# Patient Record
Sex: Male | Born: 1955 | Race: Black or African American | Hispanic: No | Smoking: Never smoker
Health system: Southern US, Community
[De-identification: ages and names within clinical notes are randomized; demographics above are authoritative.]

## PROBLEM LIST (undated history)

## (undated) DIAGNOSIS — I499 Cardiac arrhythmia, unspecified: Secondary | ICD-10-CM

## (undated) DIAGNOSIS — I10 Essential (primary) hypertension: Secondary | ICD-10-CM

## (undated) HISTORY — PX: JOINT REPLACEMENT: SHX530

---

## 2007-02-20 ENCOUNTER — Ambulatory Visit: Payer: Self-pay | Admitting: Cardiology

## 2016-05-21 ENCOUNTER — Emergency Department (HOSPITAL_COMMUNITY): Payer: Medicare Other

## 2016-05-21 ENCOUNTER — Encounter (HOSPITAL_COMMUNITY): Payer: Self-pay | Admitting: Emergency Medicine

## 2016-05-21 ENCOUNTER — Emergency Department (HOSPITAL_COMMUNITY)
Admission: EM | Admit: 2016-05-21 | Discharge: 2016-05-21 | Disposition: A | Discharge status: Home / Self Care | Payer: Medicare Other | Attending: Emergency Medicine | Admitting: Emergency Medicine

## 2016-05-21 DIAGNOSIS — E876 Hypokalemia: Secondary | ICD-10-CM | POA: Insufficient documentation

## 2016-05-21 DIAGNOSIS — R9431 Abnormal electrocardiogram [ECG] [EKG]: Secondary | ICD-10-CM

## 2016-05-21 DIAGNOSIS — Z7982 Long term (current) use of aspirin: Secondary | ICD-10-CM | POA: Insufficient documentation

## 2016-05-21 DIAGNOSIS — R531 Weakness: Secondary | ICD-10-CM | POA: Diagnosis present

## 2016-05-21 DIAGNOSIS — E871 Hypo-osmolality and hyponatremia: Secondary | ICD-10-CM | POA: Diagnosis not present

## 2016-05-21 DIAGNOSIS — F17200 Nicotine dependence, unspecified, uncomplicated: Secondary | ICD-10-CM | POA: Diagnosis not present

## 2016-05-21 DIAGNOSIS — Z79899 Other long term (current) drug therapy: Secondary | ICD-10-CM | POA: Insufficient documentation

## 2016-05-21 DIAGNOSIS — I10 Essential (primary) hypertension: Secondary | ICD-10-CM | POA: Diagnosis not present

## 2016-05-21 HISTORY — DX: Essential (primary) hypertension: I10

## 2016-05-21 LAB — CBC WITH DIFFERENTIAL/PLATELET
BASOS ABS: 0 10*3/uL (ref 0.0–0.1)
BASOS PCT: 0 %
Eosinophils Absolute: 0.1 10*3/uL (ref 0.0–0.7)
Eosinophils Relative: 1 %
HEMATOCRIT: 37.8 % — AB (ref 39.0–52.0)
Hemoglobin: 13.3 g/dL (ref 13.0–17.0)
Lymphocytes Relative: 18 %
Lymphs Abs: 0.9 10*3/uL (ref 0.7–4.0)
MCH: 31.7 pg (ref 26.0–34.0)
MCHC: 35.2 g/dL (ref 30.0–36.0)
MCV: 90 fL (ref 78.0–100.0)
MONO ABS: 0.5 10*3/uL (ref 0.1–1.0)
Monocytes Relative: 9 %
NEUTROS ABS: 3.7 10*3/uL (ref 1.7–7.7)
NEUTROS PCT: 72 %
PLATELETS: 239 10*3/uL (ref 150–400)
RBC: 4.2 MIL/uL — ABNORMAL LOW (ref 4.22–5.81)
RDW: 12.8 % (ref 11.5–15.5)
WBC: 5.2 10*3/uL (ref 4.0–10.5)

## 2016-05-21 LAB — I-STAT TROPONIN, ED: TROPONIN I, POC: 0.04 ng/mL (ref 0.00–0.08)

## 2016-05-21 LAB — BASIC METABOLIC PANEL
ANION GAP: 10 (ref 5–15)
BUN: 21 mg/dL — ABNORMAL HIGH (ref 6–20)
CALCIUM: 8.5 mg/dL — AB (ref 8.9–10.3)
CO2: 27 mmol/L (ref 22–32)
Chloride: 92 mmol/L — ABNORMAL LOW (ref 101–111)
Creatinine, Ser: 1.33 mg/dL — ABNORMAL HIGH (ref 0.61–1.24)
GFR, EST NON AFRICAN AMERICAN: 56 mL/min — AB (ref >60)
Glucose, Bld: 111 mg/dL — ABNORMAL HIGH (ref 65–99)
Potassium: 2.6 mmol/L — CL (ref 3.5–5.1)
Sodium: 129 mmol/L — ABNORMAL LOW (ref 135–145)

## 2016-05-21 MED ORDER — VITAMIN B-1 100 MG PO TABS
100.0000 mg | ORAL_TABLET | Freq: Once | ORAL | Status: AC
  Administered 2016-05-21: 100 mg via ORAL
  Filled 2016-05-21: qty 1

## 2016-05-21 MED ORDER — FOLIC ACID 1 MG PO TABS
1.0000 mg | ORAL_TABLET | Freq: Once | ORAL | Status: AC
  Administered 2016-05-21: 1 mg via ORAL
  Filled 2016-05-21: qty 1

## 2016-05-21 MED ORDER — VITAMIN B-1 100 MG PO TABS: 100.0000 mg | ORAL_TABLET | Freq: Every day | ORAL | 0 refills | Status: AC

## 2016-05-21 MED ORDER — FOLIC ACID 1 MG PO TABS: 1.0000 mg | ORAL_TABLET | Freq: Every day | ORAL | 0 refills | Status: AC

## 2016-05-21 MED ORDER — SODIUM CHLORIDE 0.9 % IV BOLUS (SEPSIS): 1000.0000 mL | Freq: Once | INTRAVENOUS | Status: DC

## 2016-05-21 MED ORDER — POTASSIUM CHLORIDE CRYS ER 20 MEQ PO TBCR
40.0000 meq | EXTENDED_RELEASE_TABLET | Freq: Once | ORAL | Status: AC
  Administered 2016-05-21: 40 meq via ORAL
  Filled 2016-05-21: qty 2

## 2016-05-21 MED ORDER — POTASSIUM CHLORIDE CRYS ER 20 MEQ PO TBCR: 40.0000 meq | EXTENDED_RELEASE_TABLET | Freq: Every day | ORAL | 0 refills | Status: DC

## 2016-05-21 NOTE — ED Notes (Signed)
Pt iv infiltrated from EMS. Pt refused another stick. edp aware. Nad. Pt drank 3 cups of water

## 2016-05-21 NOTE — ED Notes (Signed)
EDP in with pt. Per dr zammit and dr Jacqulyn BathLong, ekg does not show STEMI

## 2016-05-21 NOTE — ED Notes (Signed)
Pt states just got real hot and sweaty, denied any weakness or dizziness. Denies any sx's now. Denied any cp/tightness or pressure. Pt nondiaphoretic upon arrival

## 2016-05-21 NOTE — ED Provider Notes (Signed)
Emergency Department Provider Note   I have reviewed the triage vital signs and the nursing notes.   HISTORY  Chief Complaint Weakness   HPI Luis Nunez is a 61 y.o. male with PMH of HTN and polysubstance abuse since the emergency pertinent for evaluation of sudden onset generalized weakness and flushing/heat wave sensation. Patient has been in his normal state of health over the past several months. He's been compliant with his medications. Today he was at home when he suddenly felt warm all over with some lightheadedness. No associated chest pain, palpitations, dyspnea. Patient was not using any drugs or drinking alcohol this time. He does have a history of cocaine abuse, last used yesterday, and EtOH abuse. No change in his medications. No radiation of symptoms. No full syncope. He is currently feeling back to his normal self.  Past Medical History:  Diagnosis Date  . Hypertension     There are no active problems to display for this patient.   History reviewed. No pertinent surgical history.  Current Outpatient Rx  . Order #: [id number] Class: Historical Med  . Order #: [id number] Class: Historical Med  . Order #: [id number] Class: Historical Med  . Order #: [id number] Class: Historical Med  . Order #: [id number] Class: Print  . Order #: [id number] Class: Print  . Order #: [id number] Class: Print    Allergies Penicillins  History reviewed. No pertinent family history.  Social History Social History  Substance Use Topics  . Smoking status: Never Smoker  . Smokeless tobacco: Current User  . Alcohol use Yes     Comment: beer almost daily    Review of Systems  Constitutional: No fever/chills. Positive lightheadedness. Positive sudden warm sensation.  Eyes: No visual changes. ENT: No sore throat. Cardiovascular: Denies chest pain. Respiratory: Denies shortness of breath. Gastrointestinal: No abdominal pain.  No nausea, no vomiting.  No diarrhea.  No  constipation. Genitourinary: Negative for dysuria. Musculoskeletal: Negative for back pain. Skin: Negative for rash. Neurological: Negative for headaches, focal weakness or numbness.  10-point ROS otherwise negative.  ____________________________________________   PHYSICAL EXAM:  VITAL SIGNS: ED Triage Vitals  Enc Vitals Group     BP 05/21/16 1525 (!) 177/106     Pulse Rate 05/21/16 1525 88     Resp 05/21/16 1525 18     Temp 05/21/16 1525 97.9 F (36.6 C)     Temp Source 05/21/16 1525 Oral     SpO2 05/21/16 1525 99 %     Pain Score 05/21/16 1516 0   Constitutional: Alert and oriented. Well appearing and in no acute distress. Eyes: Conjunctivae are normal. PERRL. Head: Atraumatic. Nose: No congestion/rhinnorhea. Mouth/Throat: Mucous membranes are moist. Neck: No stridor. Cardiovascular: Normal rate, regular rhythm. Good peripheral circulation. Grossly normal heart sounds.   Respiratory: Normal respiratory effort.  No retractions. Lungs CTAB. Gastrointestinal: Soft and nontender. No distention.  Musculoskeletal: No lower extremity tenderness nor edema. No gross deformities of extremities. Neurologic:  Normal speech and language. No gross focal neurologic deficits are appreciated.  Skin:  Skin is warm, dry and intact. No rash noted. Psychiatric: Mood and affect are normal. Speech and behavior are normal. ____________________________________________   LABS (all labs ordered are listed, but only abnormal results are displayed)  Labs Reviewed  CBC WITH DIFFERENTIAL/PLATELET - Abnormal; Notable for the following:       Result Value   RBC 4.20 (*)    HCT 37.8 (*)    All other components within normal limits  BASIC METABOLIC PANEL - Abnormal; Notable  for the following:    Sodium 129 (*)    Potassium 2.6 (*)    Chloride 92 (*)    Glucose, Bld 111 (*)    BUN 21 (*)    Creatinine, Ser 1.33 (*)    Calcium 8.5 (*)    GFR calc non Af Amer 56 (*)    All other components  within normal limits  I-STAT TROPOININ, ED   ____________________________________________  EKG   EKG Interpretation  Date/Time:  Monday May 21 2016 15:25:15 EDT Ventricular Rate:  85 PR Interval:    QRS Duration: 160 QT Interval:  461 QTC Calculation: 549 R Axis:   -35 Text Interpretation:  Sinus rhythm Right bundle branch block LVH by voltage Lateral infarct, acute Prolonged QT interval No STEMI.  Confirmed by Vi Biddinger MD, Talor Cheema [phone]([id number]) on 05/21/2016 3:45:17 PM       ____________________________________________  RADIOLOGY  Dg Chest Portable 1 View  Result Date: 05/21/2016 CLINICAL DATA:  Near syncopal episode. Weakness and dizziness today. EXAM: PORTABLE CHEST 1 VIEW COMPARISON:  Radiographs 01/21/2011. FINDINGS: 1534 hours. Lordotic positioning. The heart size and mediastinal contours are normal. The lungs are clear. There is no pleural effusion or pneumothorax. No acute osseous findings are identified. Telemetry leads overlie the chest. IMPRESSION: Stable chest.  No active cardiopulmonary process. Electronically Signed   By: Carey BullocksWilliam  Veazey M.D.   On: 05/21/2016 15:51    ____________________________________________   PROCEDURES  Procedure(s) performed:   Procedures  None ____________________________________________   INITIAL IMPRESSION / ASSESSMENT AND PLAN / ED COURSE  Pertinent labs & imaging results that were available during my care of the patient were reviewed by me and considered in my medical decision making (see chart for details).  Patient presents to the emergency department for evaluation of generalized weakness and near syncope event. EKG shows right bundle with prolonged QT interval. No old tracing for comparison. Patient is a polysubstance abuser. No cocaine today but did drink 1 beer this AM.   04:35 PM Updated the patient regarding his lab results. He has hypokalemia, hyponatremia, and prolonged QT interval on EKG. With his near syncope discussed  that I was concerned regarding his electrolyte levels. He is not describing any generalized weakness or fatigue. I suspect this lab abnormalities are driven by his alcohol and other substance abuse. He endorses a poor diet. I advised that he be admitted to the hospital for repletion of his potassium and repeat EKG but he refused. I discussed that failure to treat this appropriately could lead to fatal arrhythmia and disability/death. He states that he can follow up with his PMD this week for repeat labs. Will give IV fluids here along with potassium supplementation. Also giving Thiamine and Folate.   04:50 PM Patient's IV from EMS infiltrated and patient is refusing additional IV. Will d/c IVF order. Will discharge with oral supplementation. Discussed strict return precautions and PCP follow up in the coming days as above.   EKG, labs, nursing notes, and imaging reviewed.  ____________________________________________  FINAL CLINICAL IMPRESSION(S) / ED DIAGNOSES  Final diagnoses:  Generalized weakness  Hypokalemia  Hyponatremia  Prolonged Q-T interval on ECG     MEDICATIONS GIVEN DURING THIS VISIT:  Medications  sodium chloride 0.9 % bolus 1,000 mL (not administered)  potassium chloride SA (K-DUR,KLOR-CON) CR tablet 40 mEq (not administered)  thiamine (VITAMIN B-1) tablet 100 mg (not administered)  folic acid (FOLVITE) tablet 1 mg (not administered)     NEW OUTPATIENT MEDICATIONS STARTED DURING THIS VISIT:  New Prescriptions   FOLIC ACID (FOLVITE) 1 MG TABLET    Take 1 tablet (1 mg total) by mouth daily.   POTASSIUM CHLORIDE SA (K-DUR,KLOR-CON) 20 MEQ TABLET    Take 2 tablets (40 mEq total) by mouth daily.   THIAMINE (VITAMIN B-1) 100 MG TABLET    Take 1 tablet (100 mg total) by mouth daily.      Note:  This document was prepared using Dragon voice recognition software and may include unintentional dictation errors.  Alona Bene, MD Emergency Medicine   Maia Plan,  MD 05/21/16 [phone]

## 2016-05-21 NOTE — ED Triage Notes (Signed)
Pt with wife and became weak and dizzy. cbg 140. Near syncope. Pt arrived a/o. No gen weakness noted. Pt had to have bm as soon as he got to ED. Pt stable on feet. A/o

## 2016-05-21 NOTE — Discharge Instructions (Signed)
You were seen today with sudden weakness and almost passing out. We found that your potassium was low and you EKG was abnormal. We are prescribing potassium supplements to take for the next several days along with multivitamins. You need to call your PCP tomorrow and schedule an appointment in the next 2-4 days for repeat blood work and repeat EKG.   Return to the ED with worsening weakness, numbness, fainting, chest pain, or palpitations.

## 2016-05-21 NOTE — ED Notes (Addendum)
In to dc pt and pt not in room.did not received dc papers.  Tried both number and family number with no answer or opportunity to leave message. Received number from his family that is admitted. Tried [phone] [phone] and left message.

## 2017-02-02 DIAGNOSIS — I499 Cardiac arrhythmia, unspecified: Secondary | ICD-10-CM

## 2017-02-02 HISTORY — DX: Cardiac arrhythmia, unspecified: I49.9

## 2017-02-02 NOTE — H&P (Signed)
TOTAL HIP ADMISSION H&P  Patient is admitted for left total hip arthroplasty, anterior approach.  Subjective:  Chief Complaint:   Left hip primary OA / pain  HPI: Luis Nunez, 61 y.o. male, has a history of pain and functional disability in the left hip(s) due to arthritis and patient has failed non-surgical conservative treatments for greater than 12 weeks to include NSAID's and/or analgesics and activity modification.  Onset of symptoms was gradual starting 14-15 years ago with gradually worsening course since that time.The patient noted no past surgery on the left hip(s).  Patient currently rates pain in the left hip at 3 out of 10 with activity. Patient has night pain, worsening of pain with activity and weight bearing, trendelenberg gait, pain that interfers with activities of daily living and pain with passive range of motion. Patient has evidence of periarticular osteophytes and joint space narrowing by imaging studies. This condition presents safety issues increasing the risk of falls.  There is no current active infection.  Risks, benefits and expectations were discussed with the patient.  Risks including but not limited to the risk of anesthesia, blood clots, nerve damage, blood vessel damage, failure of the prosthesis, infection and up to and including death.  Patient understand the risks, benefits and expectations and wishes to proceed with surgery.   PCP: Louie Bostonapper, David B., MD  D/C Plans:       Home  Post-op Meds:       No Rx given  Tranexamic Acid:      To be given - IV   Decadron:      Is to be given  FYI:      ASA  Norco  DME:   Rx given for - RW   PT:   No PT    Past Medical History:  Diagnosis Date  . Hypertension     No past surgical history on file.  No current facility-administered medications for this encounter.    Current Outpatient Medications  Medication Sig Dispense Refill Last Dose  . acetaminophen (TYLENOL) 650 MG CR tablet Take 1,300 mg by mouth  every 8 (eight) hours as needed for pain.   05/21/2016 at 0630  . aspirin EC 81 MG tablet Take 81 mg by mouth daily.   05/21/2016 at 0630  . Aspirin-Caffeine (BC FAST PAIN RELIEF ARTHRITIS) 1000-65 MG PACK Take 2 packets by mouth 2 (two) times daily as needed (for pain).   05/21/2016 at 0630  . lisinopril-hydrochlorothiazide (PRINZIDE,ZESTORETIC) 20-12.5 MG tablet Take 1 tablet by mouth daily.   05/21/2016 at Unknown time  . potassium chloride SA (K-DUR,KLOR-CON) 20 MEQ tablet Take 2 tablets (40 mEq total) by mouth daily. 10 tablet 0    Allergies  Allergen Reactions  . Penicillins Rash and Other (See Comments)    Has patient had a PCN reaction causing immediate rash, facial/tongue/throat swelling, SOB or lightheadedness with hypotension: No Has patient had a PCN reaction causing severe rash involving mucus membranes or skin necrosis: No Has patient had a PCN reaction that required hospitalization No Has patient had a PCN reaction occurring within the last 10 years: No If all of the above answers are "NO", then may proceed with Cephalosporin use.    Social History   Tobacco Use  . Smoking status: Never Smoker  . Smokeless tobacco: Current User  Substance Use Topics  . Alcohol use: Yes    Comment: beer almost daily       Review of Systems  Constitutional: Negative.  HENT: Negative.   Eyes: Negative.   Respiratory: Negative.   Cardiovascular: Negative.   Gastrointestinal: Negative.   Genitourinary: Negative.   Musculoskeletal: Positive for joint pain.  Skin: Negative.   Neurological: Negative.   Endo/Heme/Allergies: Negative.   Psychiatric/Behavioral: Negative.     Objective:  Physical Exam  Constitutional: He is oriented to person, place, and time. He appears well-developed.  HENT:  Head: Normocephalic.  Eyes: Pupils are equal, round, and reactive to light.  Neck: Neck supple. No JVD present. No tracheal deviation present. No thyromegaly present.  Cardiovascular: Normal  rate, regular rhythm and intact distal pulses.  Respiratory: Effort normal and breath sounds normal. No respiratory distress. He has no wheezes.  GI: Soft. There is no tenderness. There is no guarding.  Musculoskeletal:       Left hip: He exhibits decreased range of motion, decreased strength, tenderness and bony tenderness. He exhibits no swelling, no deformity and no laceration.  Lymphadenopathy:    He has no cervical adenopathy.  Neurological: He is alert and oriented to person, place, and time.  Skin: Skin is warm and dry.  Psychiatric: He has a normal mood and affect.      Imaging Review Plain radiographs demonstrate severe degenerative joint disease of the left hip(s). The bone quality appears to be good for age and reported activity level.  Assessment/Plan:  End stage arthritis, left hip(s)  The patient history, physical examination, clinical judgement of the provider and imaging studies are consistent with end stage degenerative joint disease of the left hip(s) and total hip arthroplasty is deemed medically necessary. The treatment options including medical management, injection therapy, arthroscopy and arthroplasty were discussed at length. The risks and benefits of total hip arthroplasty were presented and reviewed. The risks due to aseptic loosening, infection, stiffness, dislocation/subluxation,  thromboembolic complications and other imponderables were discussed.  The patient acknowledged the explanation, agreed to proceed with the plan and consent was signed. Patient is being admitted for inpatient treatment for surgery, pain control, PT, OT, prophylactic antibiotics, VTE prophylaxis, progressive ambulation and ADL's and discharge planning.The patient is planning to be discharged home.     Anastasio AuerbachMatthew S. Isaiah Torok   PA-C  02/02/2017, 1:30 PM

## 2017-02-04 NOTE — Progress Notes (Signed)
05-21-16 (Epic) CXR  05-14-16 (Epic) EKG

## 2017-02-04 NOTE — Patient Instructions (Signed)
Luis Nunez  02/04/2017   Your procedure is scheduled on: 02-12-17   [facility]  Entrance Report to Admitting at 9:00 AM   Call this number if you have problems the morning of surgery  [phone]   Remember: ONLY 1 PERSON MAY GO WITH YOU TO SHORT STAY TO GET  READY MORNING OF YOUR SURGERY.  Do not eat food or drink liquids :After Midnight.     Take these medicines the morning of surgery with A SIP OF WATER: None                                 You may not have any metal on your body including hair pins and              piercings  Do not wear jewelry, make-up, lotions, powders or perfumes, deodorant             Do not wear nail polish.  Do not shave  48 hours prior to surgery.              Men may shave face and neck.   Do not bring valuables to the hospital. [facility] IS NOT             RESPONSIBLE   FOR VALUABLES.  Contacts, dentures or bridgework may not be worn into surgery.  Leave suitcase in the car. After surgery it may be brought to your room.                Please read over the following fact sheets you were given: _____________________________________________________________________             [facility] Before surgery, you can play an important role.  Because skin is not sterile, your skin needs to be as free of germs as possible.  You can reduce the number of germs on your skin by washing with CHG (chlorahexidine gluconate) soap before surgery.  CHG is an antiseptic cleaner which kills germs and bonds with the skin to continue killing germs even after washing. Please DO NOT use if you have an allergy to CHG or antibacterial soaps.  If your skin becomes reddened/irritated stop using the CHG and inform your nurse when you arrive at Short Stay. Do not shave (including legs and underarms) for at least 48 hours prior to the first CHG shower.  You may shave your face/neck. Please follow these  instructions carefully:  1.  Shower with CHG Soap the night before surgery and the  morning of Surgery.  2.  If you choose to wash your hair, wash your hair first as usual with your  normal  shampoo.  3.  After you shampoo, rinse your hair and body thoroughly to remove the  shampoo.                           4.  Use CHG as you would any other liquid soap.  You can apply chg directly  to the skin and wash                       Gently with a scrungie or clean washcloth.  5.  Apply the CHG Soap to your body ONLY FROM THE NECK  DOWN.   Do not use on face/ open                           Wound or open sores. Avoid contact with eyes, ears mouth and genitals (private parts).                       Wash face,  Genitals (private parts) with your normal soap.             6.  Wash thoroughly, paying special attention to the area where your surgery  will be performed.  7.  Thoroughly rinse your body with warm water from the neck down.  8.  DO NOT shower/wash with your normal soap after using and rinsing off  the CHG Soap.                9.  Pat yourself dry with a clean towel.            10.  Wear clean pajamas.            11.  Place clean sheets on your bed the night of your first shower and do not  sleep with pets. Day of Surgery : Do not apply any lotions/deodorants the morning of surgery.  Please wear clean clothes to the hospital/surgery center.  FAILURE TO FOLLOW THESE INSTRUCTIONS MAY RESULT IN THE CANCELLATION OF YOUR SURGERY PATIENT SIGNATURE_________________________________  NURSE SIGNATURE__________________________________  ________________________________________________________________________   Adam Phenix  An incentive spirometer is a tool that can help keep your lungs clear and active. This tool measures how well you are filling your lungs with each breath. Taking long deep breaths may help reverse or decrease the chance of developing breathing (pulmonary) problems (especially  infection) following:  A long period of time when you are unable to move or be active. BEFORE THE PROCEDURE   If the spirometer includes an indicator to show your best effort, your nurse or respiratory therapist will set it to a desired goal.  If possible, sit up straight or lean slightly forward. Try not to slouch.  Hold the incentive spirometer in an upright position. INSTRUCTIONS FOR USE  1. Sit on the edge of your bed if possible, or sit up as far as you can in bed or on a chair. 2. Hold the incentive spirometer in an upright position. 3. Breathe out normally. 4. Place the mouthpiece in your mouth and seal your lips tightly around it. 5. Breathe in slowly and as deeply as possible, raising the piston or the ball toward the top of the column. 6. Hold your breath for 3-5 seconds or for as long as possible. Allow the piston or ball to fall to the bottom of the column. 7. Remove the mouthpiece from your mouth and breathe out normally. 8. Rest for a few seconds and repeat Steps 1 through 7 at least 10 times every 1-2 hours when you are awake. Take your time and take a few normal breaths between deep breaths. 9. The spirometer may include an indicator to show your best effort. Use the indicator as a goal to work toward during each repetition. 10. After each set of 10 deep breaths, practice coughing to be sure your lungs are clear. If you have an incision (the cut made at the time of surgery), support your incision when coughing by placing a pillow or rolled up towels firmly against it. Once you are able to  get out of bed, walk around indoors and cough well. You may stop using the incentive spirometer when instructed by your caregiver.  RISKS AND COMPLICATIONS  Take your time so you do not get dizzy or light-headed.  If you are in pain, you may need to take or ask for pain medication before doing incentive spirometry. It is harder to take a deep breath if you are having pain. AFTER  USE  Rest and breathe slowly and easily.  It can be helpful to keep track of a log of your progress. Your caregiver can provide you with a simple table to help with this. If you are using the spirometer at home, follow these instructions: [place] IF:   You are having difficultly using the spirometer.  You have trouble using the spirometer as often as instructed.  Your pain medication is not giving enough relief while using the spirometer.  You develop fever of 100.5 F (38.1 C) or higher. SEEK IMMEDIATE MEDICAL CARE IF:   You cough up bloody sputum that had not been present before.  You develop fever of 102 F (38.9 C) or greater.  You develop worsening pain at or near the incision site. MAKE SURE YOU:   Understand these instructions.  Will watch your condition.  Will get help right away if you are not doing well or get worse. Document Released: 07/02/2006 Document Revised: 05/14/2011 Document Reviewed: 09/02/2006 Exit[employer] Patient Information 2014 ExitCare, [place].   ________________________________________________________________________  WHAT IS A BLOOD TRANSFUSION? Blood Transfusion Information  A transfusion is the replacement of blood or some of its parts. Blood is made up of multiple cells which provide different functions.  Red blood cells carry oxygen and are used for blood loss replacement.  White blood cells fight against infection.  Platelets control bleeding.  Plasma helps clot blood.  Other blood products are available for specialized needs, such as hemophilia or other clotting disorders. BEFORE THE TRANSFUSION  Who gives blood for transfusions?   Healthy volunteers who are fully evaluated to make sure their blood is safe. This is blood bank blood. Transfusion therapy is the safest it has ever been in the practice of medicine. Before blood is taken from a donor, a complete history is taken to make sure that person has no history of diseases  nor engages in risky social behavior (examples are intravenous drug use or sexual activity with multiple partners). The donor's travel history is screened to minimize risk of transmitting infections, such as malaria. The donated blood is tested for signs of infectious diseases, such as HIV and hepatitis. The blood is then tested to be sure it is compatible with you in order to minimize the chance of a transfusion reaction. If you or a relative donates blood, this is often done in anticipation of surgery and is not appropriate for emergency situations. It takes many days to process the donated blood. RISKS AND COMPLICATIONS Although transfusion therapy is very safe and saves many lives, the main dangers of transfusion include:   Getting an infectious disease.  Developing a transfusion reaction. This is an allergic reaction to something in the blood you were given. Every precaution is taken to prevent this. The decision to have a blood transfusion has been considered carefully by your caregiver before blood is given. Blood is not given unless the benefits outweigh the risks. AFTER THE TRANSFUSION  Right after receiving a blood transfusion, you will usually feel much better and more energetic. This is especially true if  your red blood cells have gotten low (anemic). The transfusion raises the level of the red blood cells which carry oxygen, and this usually causes an energy increase.  The nurse administering the transfusion will monitor you carefully for complications. HOME CARE INSTRUCTIONS  No special instructions are needed after a transfusion. You may find your energy is better. Speak with your caregiver about any limitations on activity for underlying diseases you may have. SEEK MEDICAL CARE IF:   Your condition is not improving after your transfusion.  You develop redness or irritation at the intravenous (IV) site. SEEK IMMEDIATE MEDICAL CARE IF:  Any of the following symptoms occur over the  next 12 hours:  Shaking chills.  You have a temperature by mouth above 102 F (38.9 C), not controlled by medicine.  Chest, back, or muscle pain.  People around you feel you are not acting correctly or are confused.  Shortness of breath or difficulty breathing.  Dizziness and fainting.  You get a rash or develop hives.  You have a decrease in urine output.  Your urine turns a dark color or changes to pink, red, or brown. Any of the following symptoms occur over the next 10 days:  You have a temperature by mouth above 102 F (38.9 C), not controlled by medicine.  Shortness of breath.  Weakness after normal activity.  The white part of the eye turns yellow (jaundice).  You have a decrease in the amount of urine or are urinating less often.  Your urine turns a dark color or changes to pink, red, or brown. Document Released: 02/17/2000 Document Revised: 05/14/2011 Document Reviewed: 10/06/2007 [facility] Patient Information 2014 [place], [place].  _______________________________________________________________________

## 2017-02-07 ENCOUNTER — Other Ambulatory Visit: Payer: Self-pay

## 2017-02-07 ENCOUNTER — Encounter (HOSPITAL_COMMUNITY)
Admission: RE | Admit: 2017-02-07 | Discharge: 2017-02-07 | Disposition: A | Discharge status: Home / Self Care | Payer: Medicare Other | Source: Ambulatory Visit | Attending: Orthopedic Surgery | Admitting: Orthopedic Surgery

## 2017-02-07 ENCOUNTER — Encounter (HOSPITAL_COMMUNITY): Payer: Self-pay

## 2017-02-07 DIAGNOSIS — M1612 Unilateral primary osteoarthritis, left hip: Secondary | ICD-10-CM | POA: Diagnosis not present

## 2017-02-07 DIAGNOSIS — Z0183 Encounter for blood typing: Secondary | ICD-10-CM | POA: Insufficient documentation

## 2017-02-07 DIAGNOSIS — Z01812 Encounter for preprocedural laboratory examination: Secondary | ICD-10-CM | POA: Insufficient documentation

## 2017-02-07 LAB — BASIC METABOLIC PANEL
Anion gap: 6 (ref 5–15)
BUN: 22 mg/dL — AB (ref 6–20)
CHLORIDE: 104 mmol/L (ref 101–111)
CO2: 27 mmol/L (ref 22–32)
CREATININE: 1.25 mg/dL — AB (ref 0.61–1.24)
Calcium: 8.7 mg/dL — ABNORMAL LOW (ref 8.9–10.3)
GFR calc Af Amer: >60 mL/min (ref >60)
GFR calc non Af Amer: >60 mL/min (ref >60)
Glucose, Bld: 95 mg/dL (ref 65–99)
Potassium: 4 mmol/L (ref 3.5–5.1)
Sodium: 137 mmol/L (ref 135–145)

## 2017-02-07 LAB — TYPE AND SCREEN
ABO/RH(D): B POS
ANTIBODY SCREEN: NEGATIVE

## 2017-02-07 LAB — ABO/RH: ABO/RH(D): B POS

## 2017-02-07 LAB — SURGICAL PCR SCREEN
MRSA, PCR: NEGATIVE
Staphylococcus aureus: NEGATIVE

## 2017-02-12 ENCOUNTER — Encounter (HOSPITAL_COMMUNITY): Payer: Self-pay | Admitting: Anesthesiology

## 2017-02-12 ENCOUNTER — Encounter (HOSPITAL_COMMUNITY): Admission: RE | Payer: Self-pay | Source: Ambulatory Visit

## 2017-02-12 ENCOUNTER — Inpatient Hospital Stay (HOSPITAL_COMMUNITY): Admission: RE | Admit: 2017-02-12 | Payer: Medicare Other | Source: Ambulatory Visit | Admitting: Orthopedic Surgery

## 2017-02-12 SURGERY — ARTHROPLASTY, HIP, TOTAL, ANTERIOR APPROACH
Anesthesia: Spinal | Site: Hip | Laterality: Left

## 2017-02-14 ENCOUNTER — Other Ambulatory Visit: Payer: Self-pay

## 2017-02-14 ENCOUNTER — Telehealth: Payer: Self-pay | Admitting: Cardiovascular Disease

## 2017-02-14 ENCOUNTER — Ambulatory Visit (INDEPENDENT_AMBULATORY_CARE_PROVIDER_SITE_OTHER): Payer: Medicare Other | Admitting: Cardiovascular Disease

## 2017-02-14 ENCOUNTER — Encounter: Payer: Self-pay | Admitting: Cardiovascular Disease

## 2017-02-14 VITALS — BP 172/106 | HR 87 | Ht 70.0 in | Wt 213.0 lb

## 2017-02-14 DIAGNOSIS — I4892 Unspecified atrial flutter: Secondary | ICD-10-CM | POA: Diagnosis not present

## 2017-02-14 DIAGNOSIS — Z7189 Other specified counseling: Secondary | ICD-10-CM

## 2017-02-14 DIAGNOSIS — I10 Essential (primary) hypertension: Secondary | ICD-10-CM | POA: Diagnosis not present

## 2017-02-14 DIAGNOSIS — Z9289 Personal history of other medical treatment: Secondary | ICD-10-CM | POA: Diagnosis not present

## 2017-02-14 MED ORDER — APIXABAN 5 MG PO TABS: 5.0000 mg | ORAL_TABLET | Freq: Two times a day (BID) | ORAL | 0 refills | Status: DC

## 2017-02-14 MED ORDER — APIXABAN 5 MG PO TABS: 5.0000 mg | ORAL_TABLET | Freq: Two times a day (BID) | ORAL | 6 refills | Status: DC

## 2017-02-14 NOTE — Progress Notes (Signed)
CARDIOLOGY CONSULT NOTE  Patient ID: Haroun Cotham MRN: [id number] DOB/AGE: [birth year] 61 y.o.  Admit date: (Not on file) Primary Physician: Louie Boston., MD Referring Physician: Dr. Margo Common  Reason for Consultation: Atrial flutter  HPI: Agostino Gorin is a 61 y.o. male who is being seen today for the evaluation of atrial flutter at the request of Louie Boston., MD.   Past medical history includes hypertension.  Personally reviewed all relevant documentation from his PCP.  He was hospitalized at [facility] earlier this month for rapid atrial flutter.  Cardiac enzymes were checked and found to be negative.  He was placed on intravenous diltiazem infusion.  Chest x-ray demonstrated cardiomegaly.  He was started on metoprolol and warfarin.  He apparently was due to have hip replacement and was sent by his PCP to the emergency room to obtain an ECG.  Relevant labs: Hemoglobin 13.4, white blood cell 7.1, platelets 289, sodium 140, potassium 3.8, BUN 20, creatinine 1.17.  Unfortunately, no ECGs are available to me at this time.  I will ask my nurse to request them.  He tells me he was diagnosed with hypertension at the age of 67 when he entered the [employer].  He underwent a physical at the age of 71 and was asked to see a cardiologist because he was told he may have had a heart attack.  It sounds like he underwent coronary angiography and was told it was normal.  He denies chest pain, palpitations, shortness of breath, dizziness, and leg swelling.  He was completely unaware that he was in an abnormal heart rhythm while hospitalized recently.  He did not take his medications today.  He has been on lisinopril and hydrochlorothiazide for some time and was recently started on metoprolol.  ECG performed today which I personally interpreted demonstrated atrial flutter with an underlying right bundle branch block and somewhat variable conduction, heart rate 72 bpm.   Allergies    Allergen Reactions  . Penicillins Rash and Other (See Comments)    Has patient had a PCN reaction causing immediate rash, facial/tongue/throat swelling, SOB or lightheadedness with hypotension: No Has patient had a PCN reaction causing severe rash involving mucus membranes or skin necrosis: No Has patient had a PCN reaction that required hospitalization No Has patient had a PCN reaction occurring within the last 10 years: No If all of the above answers are "NO", then may proceed with Cephalosporin use.    Current Outpatient Medications  Medication Sig Dispense Refill  . acetaminophen (TYLENOL) 650 MG CR tablet Take 1,300 mg by mouth every 8 (eight) hours as needed for pain.    Marland Kitchen aspirin EC 81 MG tablet Take 81 mg by mouth daily.    . Aspirin-Caffeine (BC FAST PAIN RELIEF ARTHRITIS) 1000-65 MG PACK Take 2 packets by mouth 2 (two) times daily as needed (for pain).    Marland Kitchen lisinopril-hydrochlorothiazide (PRINZIDE,ZESTORETIC) 20-12.5 MG tablet Take 2 tablets by mouth daily.     . metoprolol tartrate (LOPRESSOR) 25 MG tablet Take 25 mg by mouth 2 (two) times daily.    Marland Kitchen warfarin (COUMADIN) 5 MG tablet Take 5 mg by mouth daily.     No current facility-administered medications for this visit.     Past Medical History:  Diagnosis Date  . Hypertension     History reviewed. No pertinent surgical history.  Social History   Socioeconomic History  . Marital status: Divorced    Spouse name: Not on file  .  Number of children: Not on file  . Years of education: Not on file  . Highest education level: Not on file  Social Needs  . Financial resource strain: Not on file  . Food insecurity - worry: Not on file  . Food insecurity - inability: Not on file  . Transportation needs - medical: Not on file  . Transportation needs - non-medical: Not on file  Occupational History  . Not on file  Tobacco Use  . Smoking status: Never Smoker  . Smokeless tobacco: Current User  Substance and Sexual  Activity  . Alcohol use: Yes    Comment: beer almost daily  . Drug use: Yes    Types: Cocaine    Comment: last used cocaine yesterday.   Marland Kitchen. Sexual activity: No  Other Topics Concern  . Not on file  Social History Narrative  . Not on file     No family history of premature CAD in 1st degree relatives.  Current Meds  Medication Sig  . acetaminophen (TYLENOL) 650 MG CR tablet Take 1,300 mg by mouth every 8 (eight) hours as needed for pain.  Marland Kitchen. aspirin EC 81 MG tablet Take 81 mg by mouth daily.  . Aspirin-Caffeine (BC FAST PAIN RELIEF ARTHRITIS) 1000-65 MG PACK Take 2 packets by mouth 2 (two) times daily as needed (for pain).  Marland Kitchen. lisinopril-hydrochlorothiazide (PRINZIDE,ZESTORETIC) 20-12.5 MG tablet Take 2 tablets by mouth daily.   . metoprolol tartrate (LOPRESSOR) 25 MG tablet Take 25 mg by mouth 2 (two) times daily.  Marland Kitchen. warfarin (COUMADIN) 5 MG tablet Take 5 mg by mouth daily.      Review of systems complete and found to be negative unless listed above in HPI    Physical exam Blood pressure (!) 172/106, pulse 87, height 5\' 10"  (1.778 m), weight 213 lb (96.6 kg), SpO2 97 %. General: NAD Neck: No JVD, no thyromegaly or thyroid nodule.  Lungs: Clear to auscultation bilaterally with normal respiratory effort. CV: Nondisplaced PMI. Regular rate and irregular rhythm, normal S1/S2, no S3, no murmur.  No peripheral edema.  No carotid bruit.    Abdomen: Soft, nontender, no distention.  Skin: Intact without lesions or rashes.  Neurologic: Alert and oriented x 3.  Psych: Normal affect. Extremities: No clubbing or cyanosis.  HEENT: Normal.   ECG: Most recent ECG reviewed.   Labs: Lab Results  Component Value Date/Time   K 4.0 02/07/2017 10:00 AM   BUN 22 (H) 02/07/2017 10:00 AM   CREATININE 1.25 (H) 02/07/2017 10:00 AM   HGB 13.3 05/21/2016 03:33 PM     Lipids: No results found for: LDLCALC, LDLDIRECT, CHOL, TRIG, HDL      ASSESSMENT AND PLAN:  1.  Atrial flutter: He is  entirely asymptomatic. CHADSVASC score is 1, thus anticoagulation is not definitively indicated as thromboembolic risk is lower.  However, I will elect to anticoagulate but I will discontinue warfarin and start apixaban 5 mg twice daily.  I will obtain an echocardiogram to evaluate cardiac structure and function.  I spoke to him about potential ablation with referral to an electrophysiologist.  Heart rate is currently controlled without AV nodal blocking agents as he has not taken his metoprolol today.  However, given his hypertension and likely propensity for tachycardia, I think it is reasonable to continue metoprolol. I will make a referral to electrophysiology for ablation consideration.  2.  Chronic hypertension: Blood pressure is elevated but he has not taken his medications today and he has recently started metoprolol  in addition to his lisinopril and hydrochlorothiazide.  I will monitor this to see if further antihypertensive titration is indicated.  I will obtain an echocardiogram to evaluate cardiac structure and function.     Disposition: Follow up in 2 months  Signed: Prentice DockerSuresh Sashay Felling, M.D., F.A.C.C.  02/14/2017, 8:57 AM

## 2017-02-14 NOTE — Addendum Note (Signed)
Addended by: Lesle ChrisHILL, ANGELA G on: 02/14/2017 10:35 AM   Modules accepted: Orders

## 2017-02-14 NOTE — Telephone Encounter (Signed)
Pre-cert Verification for the following procedure   °Echo scheduled for 02-20-17  °

## 2017-02-14 NOTE — Patient Instructions (Addendum)
Medication Instructions:   Stop Warfarin  Stop Aspirin   Begin Eliquis 5mg  twice a day.   Continue all other medications.    Labwork: none  Testing/Procedures:  Your physician has requested that you have an echocardiogram. Echocardiography is a painless test that uses sound waves to create images of your heart. It provides your doctor with information about the size and shape of your heart and how well your heart's chambers and valves are working. This procedure takes approximately one hour. There are no restrictions for this procedure.  Office will contact with results via phone or letter.    Follow-Up: 2 months   Any Other Special Instructions Will Be Listed Below (If Applicable).  You have been referred to:  Dr. Loman BrooklynWill Camnitz (EP)   Establish with anticoagulation nurse Vashti Hey(Lisa Reid, RN) - 1 month.  If you need a refill on your cardiac medications before your next appointment, please call your pharmacy.

## 2017-02-20 ENCOUNTER — Other Ambulatory Visit: Payer: Self-pay

## 2017-02-20 ENCOUNTER — Ambulatory Visit (INDEPENDENT_AMBULATORY_CARE_PROVIDER_SITE_OTHER): Payer: Medicare Other

## 2017-02-20 ENCOUNTER — Telehealth: Payer: Self-pay

## 2017-02-20 DIAGNOSIS — I4892 Unspecified atrial flutter: Secondary | ICD-10-CM | POA: Diagnosis not present

## 2017-02-20 NOTE — Telephone Encounter (Signed)
LMTCB

## 2017-02-20 NOTE — Telephone Encounter (Signed)
-----   Message from Laqueta LindenSuresh A Koneswaran, MD sent at 02/20/2017 12:58 PM EST ----- Normal pumping function, mild valve leakage, and muscular wall thickening indicative of longstanding high blood pressure.

## 2017-03-01 NOTE — Telephone Encounter (Signed)
Unable to reach patient. Sent to Dr. Caesar ChestnutKoneswarans nurse to give results.

## 2017-03-06 ENCOUNTER — Telehealth: Payer: Self-pay | Admitting: *Deleted

## 2017-03-06 NOTE — Telephone Encounter (Signed)
Notes recorded by Lesle ChrisHill, Saylee Sherrill G, LPN on 1/6/10961/04/2017 at 1:50 PM EST Patient notified. Copy to pmd. Follow up scheduled for February.   ------  Notes recorded by Lesle ChrisHill, Renie Stelmach G, LPN on 04/54/098112/31/2018 at 1:45 PM EST Left message to return call.  ------  Notes recorded by Laqueta LindenKoneswaran, Suresh A, MD on 02/20/2017 at 12:58 PM EST Normal pumping function, mild valve leakage, and muscular wall thickening indicative of longstanding high blood pressure.

## 2017-03-12 ENCOUNTER — Ambulatory Visit (INDEPENDENT_AMBULATORY_CARE_PROVIDER_SITE_OTHER): Payer: Medicare Other | Admitting: Cardiology

## 2017-03-12 ENCOUNTER — Encounter: Payer: Self-pay | Admitting: Cardiology

## 2017-03-12 VITALS — BP 146/100 | HR 54 | Ht 70.0 in | Wt 207.0 lb

## 2017-03-12 DIAGNOSIS — I10 Essential (primary) hypertension: Secondary | ICD-10-CM | POA: Diagnosis not present

## 2017-03-12 DIAGNOSIS — I483 Typical atrial flutter: Secondary | ICD-10-CM

## 2017-03-12 NOTE — Progress Notes (Signed)
Electrophysiology Office Note   Date:  03/12/2017   ID:  Luis Nunez, DOB [birth year], MRN [id number]  PCP:  Louie Boston., MD  Cardiologist:  Purvis Sheffield Primary Electrophysiologist:  Regan Lemming, MD    Chief Complaint  Patient presents with  . Advice Only    AFlutter     History of Present Illness: Luis Nunez is a 62 y.o. male who is being seen today for the evaluation of atrial flutter at the request of Prentice Docker. Presenting today for electrophysiology evaluation.  He has a past history of hypertension.  He was hospitalized in December due to atrial flutter.  He was started on metoprolol and Coumadin.  He was diagnosed with hypertension at the age of 89 when he entered the [employer].  He has had coronary angiography in the past at age 89 and was told that this was normal.    Today, he denies symptoms of palpitations, chest pain, shortness of breath, orthopnea, PND, lower extremity edema, claudication, dizziness, presyncope, syncope, bleeding, or neurologic sequela. The patient is tolerating medications without difficulties.  He is currently feeling well without major complaint other than hip pain.  He does not note palpitations, weakness, fatigue, or shortness of breath.   Past Medical History:  Diagnosis Date  . Hypertension    No past surgical history on file.   Current Outpatient Medications  Medication Sig Dispense Refill  . acetaminophen (TYLENOL) 650 MG CR tablet Take 1,300 mg by mouth every 8 (eight) hours as needed for pain.    Marland Kitchen apixaban (ELIQUIS) 5 MG TABS tablet Take 1 tablet (5 mg total) by mouth 2 (two) times daily. 14 tablet 0  . Aspirin-Caffeine (BC FAST PAIN RELIEF ARTHRITIS) 1000-65 MG PACK Take 2 packets by mouth 2 (two) times daily as needed (for pain).    Marland Kitchen lisinopril-hydrochlorothiazide (PRINZIDE,ZESTORETIC) 20-12.5 MG tablet Take 2 tablets by mouth daily.     . metoprolol tartrate (LOPRESSOR) 25 MG tablet Take 25 mg by mouth 2 (two)  times daily.     No current facility-administered medications for this visit.     Allergies:   Penicillins   Social History:  The patient  reports that  has never smoked. He uses smokeless tobacco. He reports that he drinks alcohol. He reports that he uses drugs. Drug: Cocaine.   Family History:  The patient's family history includes Bone cancer in his brother; Breast cancer in his sister; Colon cancer in his brother; High blood pressure in his sister.    ROS:  Please see the history of present illness.   Otherwise, review of systems is positive for cough.   All other systems are reviewed and negative.    PHYSICAL EXAM: VS:  BP (!) 146/100   Pulse (!) 54   Ht 5\' 10"  (1.778 m)   Wt 207 lb (93.9 kg)   SpO2 98%   BMI 29.70 kg/m  , BMI Body mass index is 29.7 kg/m. GEN: Well nourished, well developed, in no acute distress  HEENT: normal  Neck: no JVD, carotid bruits, or masses Cardiac: RRR; no murmurs, rubs, or gallops,no edema  Respiratory:  clear to auscultation bilaterally, normal work of breathing GI: soft, nontender, nondistended, + BS MS: no deformity or atrophy  Skin: warm and dry Neuro:  Strength and sensation are intact Psych: euthymic mood, full affect  EKG:  EKG is not ordered today. Personal review of the ekg ordered 02/14/17 shows atrial flutter, RBBB  Recent Labs: 05/21/2016: Hemoglobin 13.3; Platelets  239 02/07/2017: BUN 22; Creatinine, Ser 1.25; Potassium 4.0; Sodium 137    Lipid Panel  No results found for: CHOL, TRIG, HDL, CHOLHDL, VLDL, LDLCALC, LDLDIRECT   Wt Readings from Last 3 Encounters:  03/12/17 207 lb (93.9 kg)  02/14/17 213 lb (96.6 kg)  02/07/17 211 lb (95.7 kg)      Other studies Reviewed: Additional studies/ records that were reviewed today include: TTE 02/20/17  Review of the above records today demonstrates:  - Left ventricle: The cavity size was normal. Wall thickness was   increased in a pattern of moderate LVH. Systolic function  was   normal. The estimated ejection fraction was in the range of 60%   to 65%. Wall motion was normal; there were no regional wall   motion abnormalities. The study is not technically sufficient to   allow evaluation of LV diastolic function. - Aortic valve: Trileaflet; mildly thickened leaflets. There was   mild regurgitation. - Mitral valve: There was mild regurgitation. - Left atrium: The atrium was moderately dilated. - Right ventricle: Systolic function was mildly reduced. - Tricuspid valve: There was mild regurgitation.   ASSESSMENT AND PLAN:  1.  Atrial flutter: Currently on Eliquis.  Echo without major abnormality.  Heart rate is well controlled.  Discussed with him the risks and benefits of ablation.  Risks include bleeding, tamponade, heart block, and stroke among others.  The patient understands these risks and is agreed to the procedure.  He is having both of his hips replaced early this year.  He would like to have his procedure done after bilateral hip replacement.  We [name] see him back in 3 months for further discussions.  2.  Hypertension: Blood pressure is mildly elevated today, but apparently has been higher in the past.  We [name] continue his current medications.  He [name] likely need higher doses in the future.    Current medicines are reviewed at length with the patient today.   The patient does not have concerns regarding his medicines.  The following changes were made today:  none  Labs/ tests ordered today include:  No orders of the defined types were placed in this encounter.    Disposition:   FU with [name] 3 months  Signed, [name], MD  03/12/2017 3:05 PM     [facility] [address] [place] [id number] [phone] (office) [phone] (fax)

## 2017-03-12 NOTE — Patient Instructions (Signed)
Medication Instructions:  Your physician recommends that you continue on your current medications as directed. Please refer to the Current Medication list given to you today.  * If you need a refill on your cardiac medications before your next appointment, please call your pharmacy. *  Labwork: None ordered  Testing/Procedures: None ordered  Follow-Up: Your physician recommends that you schedule a follow-up appointment in: 3 months with Dr. Camnitz.  Thank you for choosing [facility]!!   Niyanna Asch, RN [phone]  Any Other Special Instructions Will Be Listed Below (If Applicable).  Cardiac Ablation Cardiac ablation is a procedure to disable (ablate) a small amount of heart tissue in very specific places. The heart has many electrical connections. Sometimes these connections are abnormal and can cause the heart to beat very fast or irregularly. Ablating some of the problem areas can improve the heart rhythm or return it to normal. Ablation may be done for people who:  Have Wolff-Parkinson-White syndrome.  Have fast heart rhythms (tachycardia).  Have taken medicines for an abnormal heart rhythm (arrhythmia) that were not effective or caused side effects.  Have a high-risk heartbeat that may be life-threatening.  During the procedure, a small incision is made in the neck or the groin, and a long, thin, flexible tube (catheter) is inserted into the incision and moved to the heart. Small devices (electrodes) on the tip of the catheter will send out electrical currents. A type of X-ray (fluoroscopy) will be used to help guide the catheter and to provide images of the heart. Tell a health care provider about:  Any allergies you have.  All medicines you are taking, including vitamins, herbs, eye drops, creams, and over-the-counter medicines.  Any problems you or family members have had with anesthetic medicines.  Any blood disorders you have.  Any surgeries you have  had.  Any medical conditions you have, such as kidney failure.  Whether you are pregnant or may be pregnant. What are the risks? Generally, this is a safe procedure. However, problems may occur, including:  Infection.  Bruising and bleeding at the catheter insertion site.  Bleeding into the chest, especially into the sac that surrounds the heart. This is a serious complication.  Stroke or blood clots.  Damage to other structures or organs.  Allergic reaction to medicines or dyes.  Need for a permanent pacemaker if the normal electrical system is damaged. A pacemaker is a small computer that sends electrical signals to the heart and helps your heart beat normally.  The procedure not being fully effective. This may not be recognized until months later. Repeat ablation procedures are sometimes required.  What happens before the procedure?  Follow instructions from your health care provider about eating or drinking restrictions.  Ask your health care provider about: ? Changing or stopping your regular medicines. This is especially important if you are taking diabetes medicines or blood thinners. ? Taking medicines such as aspirin and ibuprofen. These medicines can thin your blood. Do not take these medicines before your procedure if your health care provider instructs you not to.  Plan to have someone take you home from the hospital or clinic.  If you will be going home right after the procedure, plan to have someone with you for 24 hours. What happens during the procedure?  To lower your risk of infection: ? Your health care team will wash or sanitize their hands. ? Your skin will be washed with soap. ? Hair may be removed from the incision   area.  An IV tube will be inserted into one of your veins.  You will be given a medicine to help you relax (sedative).  The skin on your neck or groin will be numbed.  An incision will be made in your neck or your groin.  A needle  will be inserted through the incision and into a large vein in your neck or groin.  A catheter will be inserted into the needle and moved to your heart.  Dye may be injected through the catheter to help your surgeon see the area of the heart that needs treatment.  Electrical currents will be sent from the catheter to ablate heart tissue in desired areas. There are three types of energy that may be used to ablate heart tissue: ? Heat (radiofrequency energy). ? Laser energy. ? Extreme cold (cryoablation).  When the necessary tissue has been ablated, the catheter will be removed.  Pressure will be held on the catheter insertion area to prevent excessive bleeding.  A bandage (dressing) will be placed over the catheter insertion area. The procedure may vary among health care providers and hospitals. What happens after the procedure?  Your blood pressure, heart rate, breathing rate, and blood oxygen level will be monitored until the medicines you were given have worn off.  Your catheter insertion area will be monitored for bleeding. You will need to lie still for a few hours to ensure that you do not bleed from the catheter insertion area.  Do not drive for 24 hours or as long as directed by your health care provider. Summary  Cardiac ablation is a procedure to disable (ablate) a small amount of heart tissue in very specific places. Ablating some of the problem areas can improve the heart rhythm or return it to normal.  During the procedure, electrical currents will be sent from the catheter to ablate heart tissue in desired areas. This information is not intended to replace advice given to you by your health care provider. Make sure you discuss any questions you have with your health care provider. Document Released: 07/08/2008 Document Revised: 01/09/2016 Document Reviewed: 01/09/2016 Elsevier Interactive Patient Education  2018 Elsevier Inc.        

## 2017-03-14 ENCOUNTER — Ambulatory Visit (INDEPENDENT_AMBULATORY_CARE_PROVIDER_SITE_OTHER): Payer: Medicare Other | Admitting: *Deleted

## 2017-03-14 DIAGNOSIS — I4892 Unspecified atrial flutter: Secondary | ICD-10-CM | POA: Diagnosis not present

## 2017-03-14 NOTE — Patient Instructions (Addendum)
Luis Nunez  03/14/2017   Your procedure is scheduled on: 03-19-17   [facility]  Entrance Report to Admitting at 9:00 AM   Call this number if you have problems the morning of surgery [phone]   Remember: Do not eat food or drink liquids :After Midnight.     Take these medicines the morning of surgery with A SIP OF WATER: Metoprolol Tartrate (Lopressor)                                You may not have any metal on your body including hair pins and              piercings  Do not wear jewelry, lotions, powders or deodorant             Men may shave face and neck.   Do not bring valuables to the hospital. [facility] IS NOT             RESPONSIBLE   FOR VALUABLES.  Contacts, dentures or bridgework may not be worn into surgery.  Leave suitcase in the car. After surgery it may be brought to your room.                Please read over the following fact sheets you were given: _____________________________________________________________________           [facility] - Preparing for Surgery Before surgery, you can play an important role.  Because skin is not sterile, your skin needs to be as free of germs as possible.  You can reduce the number of germs on your skin by washing with CHG (chlorahexidine gluconate) soap before surgery.  CHG is an antiseptic cleaner which kills germs and bonds with the skin to continue killing germs even after washing. Please DO NOT use if you have an allergy to CHG or antibacterial soaps.  If your skin becomes reddened/irritated stop using the CHG and inform your nurse when you arrive at Short Stay. Do not shave (including legs and underarms) for at least 48 hours prior to the first CHG shower.  You may shave your face/neck. Please follow these instructions carefully:  1.  Shower with CHG Soap the night before surgery and the  morning of Surgery.  2.  If you choose to wash your hair, wash your hair first as usual  with your  normal  shampoo.  3.  After you shampoo, rinse your hair and body thoroughly to remove the  shampoo.                           4.  Use CHG as you would any other liquid soap.  You can apply chg directly  to the skin and wash                       Gently with a scrungie or clean washcloth.  5.  Apply the CHG Soap to your body ONLY FROM THE NECK DOWN.   Do not use on face/ open                           Wound or open sores. Avoid contact with eyes, ears mouth and genitals (private parts).  Wash face,  Genitals (private parts) with your normal soap.             6.  Wash thoroughly, paying special attention to the area where your surgery  will be performed.  7.  Thoroughly rinse your body with warm water from the neck down.  8.  DO NOT shower/wash with your normal soap after using and rinsing off  the CHG Soap.                9.  Pat yourself dry with a clean towel.            10.  Wear clean pajamas.            11.  Place clean sheets on your bed the night of your first shower and do not  sleep with pets. Day of Surgery : Do not apply any lotions/deodorants the morning of surgery.  Please wear clean clothes to the hospital/surgery center.  FAILURE TO FOLLOW THESE INSTRUCTIONS MAY RESULT IN THE CANCELLATION OF YOUR SURGERY PATIENT SIGNATURE_________________________________  NURSE SIGNATURE__________________________________  ________________________________________________________________________   Luis Nunez  An incentive spirometer is a tool that can help keep your lungs clear and active. This tool measures how well you are filling your lungs with each breath. Taking long deep breaths may help reverse or decrease the chance of developing breathing (pulmonary) problems (especially infection) following:  A long period of time when you are unable to move or be active. BEFORE THE PROCEDURE   If the spirometer includes an indicator to show your best  effort, your nurse or respiratory therapist will set it to a desired goal.  If possible, sit up straight or lean slightly forward. Try not to slouch.  Hold the incentive spirometer in an upright position. INSTRUCTIONS FOR USE  1. Sit on the edge of your bed if possible, or sit up as far as you can in bed or on a chair. 2. Hold the incentive spirometer in an upright position. 3. Breathe out normally. 4. Place the mouthpiece in your mouth and seal your lips tightly around it. 5. Breathe in slowly and as deeply as possible, raising the piston or the ball toward the top of the column. 6. Hold your breath for 3-5 seconds or for as long as possible. Allow the piston or ball to fall to the bottom of the column. 7. Remove the mouthpiece from your mouth and breathe out normally. 8. Rest for a few seconds and repeat Steps 1 through 7 at least 10 times every 1-2 hours when you are awake. Take your time and take a few normal breaths between deep breaths. 9. The spirometer may include an indicator to show your best effort. Use the indicator as a goal to work toward during each repetition. 10. After each set of 10 deep breaths, practice coughing to be sure your lungs are clear. If you have an incision (the cut made at the time of surgery), support your incision when coughing by placing a pillow or rolled up towels firmly against it. Once you are able to get out of bed, walk around indoors and cough well. You may stop using the incentive spirometer when instructed by your caregiver.  RISKS AND COMPLICATIONS  Take your time so you do not get dizzy or light-headed.  If you are in pain, you may need to take or ask for pain medication before doing incentive spirometry. It is harder to take a deep breath if you are having pain.  AFTER USE  Rest and breathe slowly and easily.  It can be helpful to keep track of a log of your progress. Your caregiver can provide you with a simple table to help with this. If you  are using the spirometer at home, follow these instructions: SEEK MEDICAL CARE IF:   You are having difficultly using the spirometer.  You have trouble using the spirometer as often as instructed.  Your pain medication is not giving enough relief while using the spirometer.  You develop fever of 100.5 F (38.1 C) or higher. SEEK IMMEDIATE MEDICAL CARE IF:   You cough up bloody sputum that had not been present before.  You develop fever of 102 F (38.9 C) or greater.  You develop worsening pain at or near the incision site. MAKE SURE YOU:   Understand these instructions.  Will watch your condition.  Will get help right away if you are not doing well or get worse. Document Released: 07/02/2006 Document Revised: 05/14/2011 Document Reviewed: 09/02/2006 ExitCare Patient Information 2014 ExitCare, [place].   ________________________________________________________________________  WHAT IS A BLOOD TRANSFUSION? Blood Transfusion Information  A transfusion is the replacement of blood or some of its parts. Blood is made up of multiple cells which provide different functions.  Red blood cells carry oxygen and are used for blood loss replacement.  White blood cells fight against infection.  Platelets control bleeding.  Plasma helps clot blood.  Other blood products are available for specialized needs, such as hemophilia or other clotting disorders. BEFORE THE TRANSFUSION  Who gives blood for transfusions?   Healthy volunteers who are fully evaluated to make sure their blood is safe. This is blood bank blood. Transfusion therapy is the safest it has ever been in the practice of medicine. Before blood is taken from a donor, a complete history is taken to make sure that person has no history of diseases nor engages in risky social behavior (examples are intravenous drug use or sexual activity with multiple partners). The donor's travel history is screened to minimize risk of  transmitting infections, such as malaria. The donated blood is tested for signs of infectious diseases, such as HIV and hepatitis. The blood is then tested to be sure it is compatible with you in order to minimize the chance of a transfusion reaction. If you or a relative donates blood, this is often done in anticipation of surgery and is not appropriate for emergency situations. It takes many days to process the donated blood. RISKS AND COMPLICATIONS Although transfusion therapy is very safe and saves many lives, the main dangers of transfusion include:   Getting an infectious disease.  Developing a transfusion reaction. This is an allergic reaction to something in the blood you were given. Every precaution is taken to prevent this. The decision to have a blood transfusion has been considered carefully by your caregiver before blood is given. Blood is not given unless the benefits outweigh the risks. AFTER THE TRANSFUSION  Right after receiving a blood transfusion, you will usually feel much better and more energetic. This is especially true if your red blood cells have gotten low (anemic). The transfusion raises the level of the red blood cells which carry oxygen, and this usually causes an energy increase.  The nurse administering the transfusion will monitor you carefully for complications. HOME CARE INSTRUCTIONS  No special instructions are needed after a transfusion. You may find your energy is better. Speak with your caregiver about any limitations on activity for underlying diseases  you may have. SEEK MEDICAL CARE IF:   Your condition is not improving after your transfusion.  You develop redness or irritation at the intravenous (IV) site. SEEK IMMEDIATE MEDICAL CARE IF:  Any of the following symptoms occur over the next 12 hours:  Shaking chills.  You have a temperature by mouth above 102 F (38.9 C), not controlled by medicine.  Chest, back, or muscle pain.  People around you  feel you are not acting correctly or are confused.  Shortness of breath or difficulty breathing.  Dizziness and fainting.  You get a rash or develop hives.  You have a decrease in urine output.  Your urine turns a dark color or changes to pink, red, or brown. Any of the following symptoms occur over the next 10 days:  You have a temperature by mouth above 102 F (38.9 C), not controlled by medicine.  Shortness of breath.  Weakness after normal activity.  The white part of the eye turns yellow (jaundice).  You have a decrease in the amount of urine or are urinating less often.  Your urine turns a dark color or changes to pink, red, or brown. Document Released: 02/17/2000 Document Revised: 05/14/2011 Document Reviewed: 10/06/2007 [facility] Patient Information 2014 [place], [place].  _______________________________________________________________________

## 2017-03-14 NOTE — Progress Notes (Signed)
Pt was started on Eliquis 5mg  bid for atrial flutter on 02/14/18.    1 month follow up:  Pt denies any side effects since starting Eliquis.  He has not had any increased burising, abnormal bleeding or GI upset.  Labs from 02/09/17:  SCr 1.15  Hgb 13.4  Hct 39.4  CrCl 92.17  Reviewed patients medication list.  Pt is not currently on any combined P-gp and strong CYP3A4 inhibitors/inducers (ketoconazole, traconazole, ritonavir, carbamazepine, phenytoin, rifampin, St. John's wort).  [facility]/17 (pre-op)  SCr 1.57, Weight 94kg, CrCl 65.69.  Dose  Is appropriate based on age, weight, and SCr.  Hgb and HCT:13.9/40.9  A full discussion of the nature of anticoagulants has been carried out.  A benefit/risk analysis has been presented to the patient, so that they understand the justification for choosing anticoagulation with Eliquis at this time.  The need for compliance is stressed.  Pt is aware to take the medication twice daily.  Side effects of potential bleeding are discussed, including unusual colored urine or stools, coughing up blood or coffee ground emesis, nose bleeds or serious fall or head trauma.  Discussed signs and symptoms of stroke. The patient should avoid any OTC items containing aspirin or ibuprofen.  Avoid alcohol consumption.   Call if any signs of abnormal bleeding.  Discussed financial obligations and resolved any difficulty in obtaining medication.  Next lab test in 6 months. (Scheduled)  Pt scheduled for total left hip arthroplasty pm 03/19/17 by Dr Charlann Boxerlin.

## 2017-03-14 NOTE — Progress Notes (Addendum)
02-14-17 (Epic) EKG  02-20-17 (Epic) ECHO  05-21-16 (Epic) CXR

## 2017-03-15 ENCOUNTER — Encounter (HOSPITAL_COMMUNITY)
Admission: RE | Admit: 2017-03-15 | Discharge: 2017-03-15 | Disposition: A | Discharge status: Home / Self Care | Payer: Medicare Other | Source: Ambulatory Visit | Attending: Orthopedic Surgery | Admitting: Orthopedic Surgery

## 2017-03-15 ENCOUNTER — Encounter (HOSPITAL_COMMUNITY): Payer: Self-pay

## 2017-03-15 ENCOUNTER — Other Ambulatory Visit: Payer: Self-pay

## 2017-03-15 DIAGNOSIS — M1611 Unilateral primary osteoarthritis, right hip: Secondary | ICD-10-CM | POA: Insufficient documentation

## 2017-03-15 DIAGNOSIS — Z01818 Encounter for other preprocedural examination: Secondary | ICD-10-CM | POA: Diagnosis present

## 2017-03-15 HISTORY — DX: Cardiac arrhythmia, unspecified: I49.9

## 2017-03-15 LAB — BASIC METABOLIC PANEL
ANION GAP: 7 (ref 5–15)
BUN: 27 mg/dL — ABNORMAL HIGH (ref 6–20)
CO2: 30 mmol/L (ref 22–32)
Calcium: 9.4 mg/dL (ref 8.9–10.3)
Chloride: 101 mmol/L (ref 101–111)
Creatinine, Ser: 1.57 mg/dL — ABNORMAL HIGH (ref 0.61–1.24)
GFR calc Af Amer: 53 mL/min — ABNORMAL LOW (ref >60)
GFR, EST NON AFRICAN AMERICAN: 46 mL/min — AB (ref >60)
Glucose, Bld: 91 mg/dL (ref 65–99)
POTASSIUM: 4.4 mmol/L (ref 3.5–5.1)
SODIUM: 138 mmol/L (ref 135–145)

## 2017-03-15 LAB — CBC
HEMATOCRIT: 40.9 % (ref 39.0–52.0)
HEMOGLOBIN: 13.9 g/dL (ref 13.0–17.0)
MCH: 31.5 pg (ref 26.0–34.0)
MCHC: 34 g/dL (ref 30.0–36.0)
MCV: 92.7 fL (ref 78.0–100.0)
Platelets: 306 10*3/uL (ref 150–400)
RBC: 4.41 MIL/uL (ref 4.22–5.81)
RDW: 13.4 % (ref 11.5–15.5)
WBC: 5.4 10*3/uL (ref 4.0–10.5)

## 2017-03-15 LAB — SURGICAL PCR SCREEN
MRSA, PCR: NEGATIVE
STAPHYLOCOCCUS AUREUS: NEGATIVE

## 2017-03-15 NOTE — Progress Notes (Signed)
03-15-17 BMP result routed to Dr. Charlann Boxerlin for review.

## 2017-03-18 NOTE — Progress Notes (Signed)
[facility] patient's scheduled surgery on 03/19/17 to see if she knows anything regarding Left hip versus right hips on this patient ??? Per Daryll BrodVerna Washington , RN.  Patient has not responded to my call from earlier today.

## 2017-03-18 NOTE — Progress Notes (Signed)
Need consent order placed in epic for left hip . Thanks.

## 2017-03-18 NOTE — Progress Notes (Signed)
LVMM for patient to call nurse back at [phone] regarding surgery on 03/19/17 to clarify left hi versus right hip.

## 2017-03-18 NOTE — Progress Notes (Signed)
LOV with Dr Purvis SheffieldKoneswaran- 02/14/17-epic LOV_ Dr Camnitz-03/12/17-epic

## 2017-03-19 ENCOUNTER — Inpatient Hospital Stay (HOSPITAL_COMMUNITY): Payer: Medicare Other

## 2017-03-19 ENCOUNTER — Inpatient Hospital Stay (HOSPITAL_COMMUNITY): Payer: Medicare Other | Admitting: Registered Nurse

## 2017-03-19 ENCOUNTER — Encounter (HOSPITAL_COMMUNITY): Payer: Self-pay | Admitting: Emergency Medicine

## 2017-03-19 ENCOUNTER — Other Ambulatory Visit: Payer: Self-pay

## 2017-03-19 ENCOUNTER — Encounter (HOSPITAL_COMMUNITY)
Admission: RE | Disposition: A | Discharge status: Home / Self Care | Payer: Self-pay | Source: Ambulatory Visit | Attending: Orthopedic Surgery

## 2017-03-19 ENCOUNTER — Inpatient Hospital Stay (HOSPITAL_COMMUNITY)
Admission: RE | Admit: 2017-03-19 | Discharge: 2017-03-20 | DRG: 470 | Disposition: A | Discharge status: Home / Self Care | Payer: Medicare Other | Source: Ambulatory Visit | Attending: Orthopedic Surgery | Admitting: Orthopedic Surgery

## 2017-03-19 DIAGNOSIS — M1612 Unilateral primary osteoarthritis, left hip: Principal | ICD-10-CM | POA: Diagnosis present

## 2017-03-19 DIAGNOSIS — I10 Essential (primary) hypertension: Secondary | ICD-10-CM | POA: Diagnosis present

## 2017-03-19 DIAGNOSIS — F17220 Nicotine dependence, chewing tobacco, uncomplicated: Secondary | ICD-10-CM | POA: Diagnosis present

## 2017-03-19 DIAGNOSIS — Z6830 Body mass index (BMI) 30.0-30.9, adult: Secondary | ICD-10-CM | POA: Diagnosis not present

## 2017-03-19 DIAGNOSIS — Z9181 History of falling: Secondary | ICD-10-CM

## 2017-03-19 DIAGNOSIS — Z96642 Presence of left artificial hip joint: Secondary | ICD-10-CM

## 2017-03-19 DIAGNOSIS — E669 Obesity, unspecified: Secondary | ICD-10-CM | POA: Diagnosis present

## 2017-03-19 DIAGNOSIS — Z880 Allergy status to penicillin: Secondary | ICD-10-CM | POA: Diagnosis not present

## 2017-03-19 DIAGNOSIS — Z96649 Presence of unspecified artificial hip joint: Secondary | ICD-10-CM

## 2017-03-19 HISTORY — PX: TOTAL HIP ARTHROPLASTY: SHX124

## 2017-03-19 LAB — TYPE AND SCREEN
ABO/RH(D): B POS
ANTIBODY SCREEN: NEGATIVE

## 2017-03-19 SURGERY — ARTHROPLASTY, HIP, TOTAL, ANTERIOR APPROACH
Anesthesia: Monitor Anesthesia Care | Site: Hip | Laterality: Left

## 2017-03-19 MED ORDER — ONDANSETRON HCL 4 MG PO TABS: 4.0000 mg | ORAL_TABLET | Freq: Four times a day (QID) | ORAL | Status: DC | PRN

## 2017-03-19 MED ORDER — PHENOL 1.4 % MT LIQD
1.0000 | OROMUCOSAL | Status: DC | PRN
  Filled 2017-03-19: qty 177

## 2017-03-19 MED ORDER — STERILE WATER FOR IRRIGATION IR SOLN
Status: DC | PRN
  Administered 2017-03-19: 2000 mL

## 2017-03-19 MED ORDER — MAGNESIUM CITRATE PO SOLN: 1.0000 | Freq: Once | ORAL | Status: DC | PRN

## 2017-03-19 MED ORDER — PROPOFOL 10 MG/ML IV BOLUS
INTRAVENOUS | Status: AC
  Filled 2017-03-19: qty 40

## 2017-03-19 MED ORDER — ACETAMINOPHEN 325 MG PO TABS: 650.0000 mg | ORAL_TABLET | ORAL | Status: DC | PRN

## 2017-03-19 MED ORDER — METOCLOPRAMIDE HCL 5 MG PO TABS: 5.0000 mg | ORAL_TABLET | Freq: Three times a day (TID) | ORAL | Status: DC | PRN

## 2017-03-19 MED ORDER — PROPOFOL 500 MG/50ML IV EMUL
INTRAVENOUS | Status: DC | PRN
  Administered 2017-03-19: 75 ug/kg/min via INTRAVENOUS

## 2017-03-19 MED ORDER — TRANEXAMIC ACID 1000 MG/10ML IV SOLN
1000.0000 mg | Freq: Once | INTRAVENOUS | Status: AC
  Administered 2017-03-19: 17:00:00 1000 mg via INTRAVENOUS
  Filled 2017-03-19: qty 1100

## 2017-03-19 MED ORDER — HYDROCODONE-ACETAMINOPHEN 7.5-325 MG PO TABS
2.0000 | ORAL_TABLET | ORAL | Status: DC | PRN
  Administered 2017-03-19 – 2017-03-20 (×4): 2 via ORAL
  Filled 2017-03-19 (×4): qty 2

## 2017-03-19 MED ORDER — ONDANSETRON HCL 4 MG/2ML IJ SOLN
INTRAMUSCULAR | Status: DC | PRN
  Administered 2017-03-19: 4 mg via INTRAVENOUS

## 2017-03-19 MED ORDER — MIDAZOLAM HCL 5 MG/5ML IJ SOLN
INTRAMUSCULAR | Status: DC | PRN
  Administered 2017-03-19: 2 mg via INTRAVENOUS

## 2017-03-19 MED ORDER — FERROUS SULFATE 325 (65 FE) MG PO TABS
325.0000 mg | ORAL_TABLET | Freq: Three times a day (TID) | ORAL | Status: DC
  Administered 2017-03-20 (×2): 325 mg via ORAL
  Filled 2017-03-19 (×3): qty 1

## 2017-03-19 MED ORDER — ONDANSETRON HCL 4 MG/2ML IJ SOLN
INTRAMUSCULAR | Status: AC
  Filled 2017-03-19: qty 2

## 2017-03-19 MED ORDER — APIXABAN 2.5 MG PO TABS
2.5000 mg | ORAL_TABLET | Freq: Two times a day (BID) | ORAL | Status: DC
  Administered 2017-03-20: 08:00:00 2.5 mg via ORAL
  Filled 2017-03-19: qty 1

## 2017-03-19 MED ORDER — BUPIVACAINE IN DEXTROSE 0.75-8.25 % IT SOLN
INTRATHECAL | Status: DC | PRN
  Administered 2017-03-19: 2 mL via INTRATHECAL

## 2017-03-19 MED ORDER — DOCUSATE SODIUM 100 MG PO CAPS
100.0000 mg | ORAL_CAPSULE | Freq: Two times a day (BID) | ORAL | Status: DC
  Administered 2017-03-19 – 2017-03-20 (×2): 100 mg via ORAL
  Filled 2017-03-19 (×2): qty 1

## 2017-03-19 MED ORDER — HYDROCODONE-ACETAMINOPHEN 7.5-325 MG PO TABS
1.0000 | ORAL_TABLET | ORAL | Status: DC | PRN
  Administered 2017-03-20: 1 via ORAL
  Filled 2017-03-19: qty 1

## 2017-03-19 MED ORDER — HYDRALAZINE HCL 20 MG/ML IJ SOLN
INTRAMUSCULAR | Status: AC
  Filled 2017-03-19: qty 1

## 2017-03-19 MED ORDER — METHOCARBAMOL 1000 MG/10ML IJ SOLN
500.0000 mg | Freq: Four times a day (QID) | INTRAMUSCULAR | Status: DC | PRN
  Administered 2017-03-19: 500 mg via INTRAVENOUS
  Filled 2017-03-19: qty 550

## 2017-03-19 MED ORDER — BISACODYL 10 MG RE SUPP: 10.0000 mg | Freq: Every day | RECTAL | Status: DC | PRN

## 2017-03-19 MED ORDER — ONDANSETRON HCL 4 MG/2ML IJ SOLN: 4.0000 mg | Freq: Four times a day (QID) | INTRAMUSCULAR | Status: DC | PRN

## 2017-03-19 MED ORDER — FENTANYL CITRATE (PF) 100 MCG/2ML IJ SOLN
25.0000 ug | INTRAMUSCULAR | Status: DC | PRN
  Administered 2017-03-19: 50 ug via INTRAVENOUS

## 2017-03-19 MED ORDER — CHLORHEXIDINE GLUCONATE 4 % EX LIQD: 60.0000 mL | Freq: Once | CUTANEOUS | Status: DC

## 2017-03-19 MED ORDER — CELECOXIB 200 MG PO CAPS
200.0000 mg | ORAL_CAPSULE | Freq: Two times a day (BID) | ORAL | Status: DC
  Administered 2017-03-19 – 2017-03-20 (×2): 200 mg via ORAL
  Filled 2017-03-19 (×2): qty 1

## 2017-03-19 MED ORDER — DEXAMETHASONE SODIUM PHOSPHATE 10 MG/ML IJ SOLN
10.0000 mg | Freq: Once | INTRAMUSCULAR | Status: AC
  Administered 2017-03-20: 11:00:00 10 mg via INTRAVENOUS
  Filled 2017-03-19: qty 1

## 2017-03-19 MED ORDER — METHOCARBAMOL 500 MG PO TABS: 500.0000 mg | ORAL_TABLET | Freq: Four times a day (QID) | ORAL | 0 refills | Status: DC | PRN

## 2017-03-19 MED ORDER — DEXAMETHASONE SODIUM PHOSPHATE 10 MG/ML IJ SOLN
INTRAMUSCULAR | Status: DC | PRN
  Administered 2017-03-19: 10 mg via INTRAVENOUS

## 2017-03-19 MED ORDER — MIDAZOLAM HCL 2 MG/2ML IJ SOLN
INTRAMUSCULAR | Status: AC
  Filled 2017-03-19: qty 2

## 2017-03-19 MED ORDER — DEXAMETHASONE SODIUM PHOSPHATE 10 MG/ML IJ SOLN
INTRAMUSCULAR | Status: AC
  Filled 2017-03-19: qty 1

## 2017-03-19 MED ORDER — HYDROMORPHONE HCL 1 MG/ML IJ SOLN: 0.5000 mg | INTRAMUSCULAR | Status: DC | PRN

## 2017-03-19 MED ORDER — HYDRALAZINE HCL 20 MG/ML IJ SOLN
10.0000 mg | Freq: Once | INTRAMUSCULAR | Status: AC
  Administered 2017-03-19: 10 mg via INTRAVENOUS

## 2017-03-19 MED ORDER — POLYETHYLENE GLYCOL 3350 17 G PO PACK: 17.0000 g | PACK | Freq: Two times a day (BID) | ORAL | 0 refills | Status: DC

## 2017-03-19 MED ORDER — FENTANYL CITRATE (PF) 100 MCG/2ML IJ SOLN
INTRAMUSCULAR | Status: AC
  Filled 2017-03-19: qty 2

## 2017-03-19 MED ORDER — CEFAZOLIN SODIUM-DEXTROSE 2-4 GM/100ML-% IV SOLN
2.0000 g | Freq: Four times a day (QID) | INTRAVENOUS | Status: AC
  Administered 2017-03-19 (×2): 2 g via INTRAVENOUS
  Filled 2017-03-19 (×2): qty 100

## 2017-03-19 MED ORDER — FERROUS SULFATE 325 (65 FE) MG PO TABS: 325.0000 mg | ORAL_TABLET | Freq: Three times a day (TID) | ORAL | 3 refills | Status: DC

## 2017-03-19 MED ORDER — PROPOFOL 10 MG/ML IV BOLUS
INTRAVENOUS | Status: AC
Start: 2017-03-19 — End: 2017-03-19
  Filled 2017-03-19: qty 20

## 2017-03-19 MED ORDER — METHOCARBAMOL 500 MG PO TABS
500.0000 mg | ORAL_TABLET | Freq: Four times a day (QID) | ORAL | Status: DC | PRN
  Administered 2017-03-19 – 2017-03-20 (×2): 500 mg via ORAL
  Filled 2017-03-19 (×2): qty 1

## 2017-03-19 MED ORDER — HYDROCODONE-ACETAMINOPHEN 7.5-325 MG PO TABS: 1.0000 | ORAL_TABLET | ORAL | 0 refills | Status: DC | PRN

## 2017-03-19 MED ORDER — ACETAMINOPHEN 650 MG RE SUPP: 650.0000 mg | RECTAL | Status: DC | PRN

## 2017-03-19 MED ORDER — FENTANYL CITRATE (PF) 100 MCG/2ML IJ SOLN
INTRAMUSCULAR | Status: DC | PRN
  Administered 2017-03-19: 100 ug via INTRAVENOUS

## 2017-03-19 MED ORDER — TRANEXAMIC ACID 1000 MG/10ML IV SOLN
1000.0000 mg | INTRAVENOUS | Status: AC
  Administered 2017-03-19: 1000 mg via INTRAVENOUS
  Filled 2017-03-19: qty 1100

## 2017-03-19 MED ORDER — SODIUM CHLORIDE 0.9 % IR SOLN
Status: DC | PRN
  Administered 2017-03-19: 1000 mL

## 2017-03-19 MED ORDER — DOCUSATE SODIUM 100 MG PO CAPS: 100.0000 mg | ORAL_CAPSULE | Freq: Two times a day (BID) | ORAL | 0 refills | Status: DC

## 2017-03-19 MED ORDER — ALUM & MAG HYDROXIDE-SIMETH 200-200-20 MG/5ML PO SUSP: 15.0000 mL | ORAL | Status: DC | PRN

## 2017-03-19 MED ORDER — CEFAZOLIN SODIUM-DEXTROSE 2-4 GM/100ML-% IV SOLN
2.0000 g | INTRAVENOUS | Status: DC
  Filled 2017-03-19: qty 100

## 2017-03-19 MED ORDER — CEFAZOLIN SODIUM-DEXTROSE 2-3 GM-%(50ML) IV SOLR
2.0000 g | Freq: Once | INTRAVENOUS | Status: DC
  Administered 2017-03-19: 2 g via INTRAVENOUS

## 2017-03-19 MED ORDER — MENTHOL 3 MG MT LOZG: 1.0000 | LOZENGE | OROMUCOSAL | Status: DC | PRN

## 2017-03-19 MED ORDER — METOCLOPRAMIDE HCL 5 MG/ML IJ SOLN: 5.0000 mg | Freq: Three times a day (TID) | INTRAMUSCULAR | Status: DC | PRN

## 2017-03-19 MED ORDER — PROMETHAZINE HCL 25 MG/ML IJ SOLN: 6.2500 mg | INTRAMUSCULAR | Status: DC | PRN

## 2017-03-19 MED ORDER — METOPROLOL TARTRATE 25 MG PO TABS
25.0000 mg | ORAL_TABLET | Freq: Two times a day (BID) | ORAL | Status: DC
  Administered 2017-03-19 – 2017-03-20 (×2): 25 mg via ORAL
  Filled 2017-03-19 (×2): qty 1

## 2017-03-19 MED ORDER — POLYETHYLENE GLYCOL 3350 17 G PO PACK
17.0000 g | PACK | Freq: Two times a day (BID) | ORAL | Status: DC
  Administered 2017-03-19 – 2017-03-20 (×2): 17 g via ORAL
  Filled 2017-03-19 (×2): qty 1

## 2017-03-19 MED ORDER — DIPHENHYDRAMINE HCL 12.5 MG/5ML PO ELIX: 12.5000 mg | ORAL_SOLUTION | ORAL | Status: DC | PRN

## 2017-03-19 MED ORDER — LACTATED RINGERS IV SOLN
INTRAVENOUS | Status: DC
  Administered 2017-03-19 (×3): via INTRAVENOUS

## 2017-03-19 MED ORDER — SODIUM CHLORIDE 0.9 % IV SOLN: INTRAVENOUS | Status: DC

## 2017-03-19 MED ORDER — DEXAMETHASONE SODIUM PHOSPHATE 10 MG/ML IJ SOLN: 10.0000 mg | Freq: Once | INTRAMUSCULAR | Status: DC

## 2017-03-19 SURGICAL SUPPLY — 40 items
BAG DECANTER FOR FLEXI CONT (MISCELLANEOUS) IMPLANT
BAG SPEC THK2 15X12 ZIP CLS (MISCELLANEOUS)
BAG URINE DRAINAGE (UROLOGICAL SUPPLIES) ×3 IMPLANT
BAG ZIPLOCK 12X15 (MISCELLANEOUS) IMPLANT
BLADE SAG 18X100X1.27 (BLADE) ×3 IMPLANT
CAPT HIP TOTAL 2 ×3 IMPLANT
CLOTH BEACON ORANGE TIMEOUT ST (SAFETY) ×3 IMPLANT
COVER PERINEAL POST (MISCELLANEOUS) ×3 IMPLANT
COVER SURGICAL LIGHT HANDLE (MISCELLANEOUS) ×3 IMPLANT
DERMABOND ADVANCED (GAUZE/BANDAGES/DRESSINGS) ×2
DERMABOND ADVANCED .7 DNX12 (GAUZE/BANDAGES/DRESSINGS) ×1 IMPLANT
DRAPE STERI IOBAN 125X83 (DRAPES) ×3 IMPLANT
DRAPE U-SHAPE 47X51 STRL (DRAPES) ×6 IMPLANT
DRESSING AQUACEL AG SP 3.5X10 (GAUZE/BANDAGES/DRESSINGS) ×1 IMPLANT
DRSG AQUACEL AG SP 3.5X10 (GAUZE/BANDAGES/DRESSINGS) ×3
DURAPREP 26ML APPLICATOR (WOUND CARE) ×3 IMPLANT
ELECT REM PT RETURN 15FT ADLT (MISCELLANEOUS) ×3 IMPLANT
GLOVE BIOGEL M STRL SZ7.5 (GLOVE) ×3 IMPLANT
GLOVE BIOGEL PI IND STRL 7.5 (GLOVE) ×1 IMPLANT
GLOVE BIOGEL PI IND STRL 8.5 (GLOVE) IMPLANT
GLOVE BIOGEL PI INDICATOR 7.5 (GLOVE) ×2
GLOVE BIOGEL PI INDICATOR 8.5 (GLOVE)
GLOVE ECLIPSE 8.0 STRL XLNG CF (GLOVE) IMPLANT
GLOVE ORTHO TXT STRL SZ7.5 (GLOVE) ×3 IMPLANT
GOWN STRL REUS W/TWL LRG LVL3 (GOWN DISPOSABLE) ×3 IMPLANT
GOWN STRL REUS W/TWL XL LVL3 (GOWN DISPOSABLE) ×3 IMPLANT
HOLDER FOLEY CATH W/STRAP (MISCELLANEOUS) ×3 IMPLANT
PACK ANTERIOR HIP CUSTOM (KITS) ×3 IMPLANT
SUT MNCRL AB 4-0 PS2 18 (SUTURE) ×3 IMPLANT
SUT STRATAFIX 0 PDS 27 VIOLET (SUTURE)
SUT STRATAFIX 1PDS 45CM VIOLET (SUTURE) ×3 IMPLANT
SUT STRATAFIX SPIRAL PDS+ 70CM (SUTURE)
SUT VIC AB 1 CT1 36 (SUTURE) ×9 IMPLANT
SUT VIC AB 2-0 CT1 27 (SUTURE) ×4
SUT VIC AB 2-0 CT1 TAPERPNT 27 (SUTURE) ×2 IMPLANT
SUTURE STRATFX 0 PDS 27 VIOLET (SUTURE) IMPLANT
SUTURE STRATFX SPIRL PDS+ 70CM (SUTURE) IMPLANT
TRAY FOLEY W/METER SILVER 16FR (SET/KITS/TRAYS/PACK) ×3 IMPLANT
WATER STERILE IRR 1000ML POUR (IV SOLUTION) ×6 IMPLANT
YANKAUER SUCT BULB TIP 10FT TU (MISCELLANEOUS) ×3 IMPLANT

## 2017-03-19 NOTE — Anesthesia Procedure Notes (Signed)
Procedure Name: MAC Date/Time: 03/19/2017 11:25 AM Performed by: Lissa Morales, CRNA Pre-anesthesia Checklist: Patient identified, Emergency Drugs available, Suction available, Patient being monitored and Timeout performed Patient Re-evaluated:Patient Re-evaluated prior to induction Oxygen Delivery Method: Simple face mask Placement Confirmation: positive ETCO2

## 2017-03-19 NOTE — H&P (Signed)
TOTAL HIP ADMISSION H&P  Patient is admitted for left total hip arthroplasty, anterior approach.  Subjective:  Chief Complaint: Left hip primary OA / pain  HPI: Luis Nunez, 62 y.o. male, has a history of pain and functional disability in the left hip(s) due to arthritis and patient has failed non-surgical conservative treatments for greater than 12 weeks to include NSAID's and/or analgesics and activity modification.  Onset of symptoms was gradual starting 14-15 years ago with gradually worsening course since that time.The patient noted no past surgery on the left hip(s).  Patient currently rates pain in the left hip at 9 out of 10 with activity. Patient has night pain, worsening of pain with activity and weight bearing, trendelenberg gait, pain that interfers with activities of daily living and pain with passive range of motion. Patient has evidence of periarticular osteophytes and joint space narrowing by imaging studies. This condition presents safety issues increasing the risk of falls. There is no current active infection.  Risks, benefits and expectations were discussed with the patient.  Risks including but not limited to the risk of anesthesia, blood clots, nerve damage, blood vessel damage, failure of the prosthesis, infection and up to and including death.  Patient understand the risks, benefits and expectations and wishes to proceed with surgery.   PCP: Louie Bostonapper, David B., MD  D/C Plans:       Home   Post-op Meds:       No Rx given  Tranexamic Acid:      To be given - IV   Decadron:      Is to be given  FYI:      ASA  Norco  DME:     Rx given for - RW   PT:    No PT  Past Medical History:  Diagnosis Date  . Dysrhythmia 02/2017   A flutter  . Hypertension     History reviewed. No pertinent surgical history.  Current Facility-Administered Medications  Medication Dose Route Frequency Provider Last Rate Last Dose  . ceFAZolin (ANCEF) IVPB 2g/100 mL premix  2 g Intravenous  On Call to OR Geniyah Eischeid, Molli HazardMatthew, PA-C      . chlorhexidine (HIBICLENS) 4 % liquid 4 application  60 mL Topical Once Yaziel Brandon, PA-C      . dexamethasone (DECADRON) injection 10 mg  10 mg Intravenous Once Kasin Tonkinson, PA-C      . lactated ringers infusion   Intravenous Continuous Eilene Ghaziose, George, MD 50 mL/hr at 03/19/17 1004     Allergies  Allergen Reactions  . Penicillins Rash and Other (See Comments)    Has patient had a PCN reaction causing immediate rash, facial/tongue/throat swelling, SOB or lightheadedness with hypotension: No Has patient had a PCN reaction causing severe rash involving mucus membranes or skin necrosis: No Has patient had a PCN reaction that required hospitalization No Has patient had a PCN reaction occurring within the last 10 years: No If all of the above answers are "NO", then may proceed with Cephalosporin use.    Social History   Tobacco Use  . Smoking status: Never Smoker  . Smokeless tobacco: Current User    Types: Snuff  Substance Use Topics  . Alcohol use: Yes    Comment: beer almost daily    Family History  Problem Relation Age of Onset  . High blood pressure Sister   . Breast cancer Sister   . Colon cancer Brother   . Bone cancer Brother      Review  of Systems  Constitutional: Negative.   HENT: Negative.   Eyes: Negative.   Respiratory: Negative.   Cardiovascular: Negative.   Gastrointestinal: Negative.   Genitourinary: Negative.   Musculoskeletal: Positive for joint pain.  Skin: Negative.   Neurological: Negative.   Endo/Heme/Allergies: Negative.   Psychiatric/Behavioral: Negative.     Objective:  Physical Exam  Constitutional: He is oriented to person, place, and time. He appears well-developed.  HENT:  Head: Normocephalic.  Eyes: Pupils are equal, round, and reactive to light.  Neck: Neck supple. No JVD present. No tracheal deviation present. No thyromegaly present.  Cardiovascular: Normal rate, regular rhythm and intact  distal pulses.  Respiratory: Effort normal and breath sounds normal. No respiratory distress. He has no wheezes.  GI: Soft. There is no tenderness. There is no guarding.  Musculoskeletal:       Left hip: He exhibits decreased range of motion, decreased strength, tenderness and bony tenderness. He exhibits no swelling, no deformity and no laceration.  Lymphadenopathy:    He has no cervical adenopathy.  Neurological: He is alert and oriented to person, place, and time.  Skin: Skin is warm and dry.  Psychiatric: He has a normal mood and affect.    Vital signs in last 24 hours: Temp:  [99.2 F (37.3 C)] 99.2 F (37.3 C) (01/15 1004) Pulse Rate:  [68] 68 (01/15 1004) Resp:  [18] 18 (01/15 1004) BP: (173)/(114) 173/114 (01/15 1004) SpO2:  [100 %] 100 % (01/15 1004) Weight:  [96.2 kg (212 lb)] 96.2 kg (212 lb) (01/15 1002)  Labs:   Estimated body mass index is 30.42 kg/m as calculated from the following:   Height as of this encounter: 5\' 10"  (1.778 m).   Weight as of this encounter: 96.2 kg (212 lb).   Imaging Review Plain radiographs demonstrate severe degenerative joint disease of the left hip(s). The bone quality appears to be good for age and reported activity level.  Assessment/Plan:  End stage arthritis, left hip(s)  The patient history, physical examination, clinical judgement of the provider and imaging studies are consistent with end stage degenerative joint disease of the left hip(s) and total hip arthroplasty is deemed medically necessary. The treatment options including medical management, injection therapy, arthroscopy and arthroplasty were discussed at length. The risks and benefits of total hip arthroplasty were presented and reviewed. The risks due to aseptic loosening, infection, stiffness, dislocation/subluxation,  thromboembolic complications and other imponderables were discussed.  The patient acknowledged the explanation, agreed to proceed with the plan and consent  was signed. Patient is being admitted for inpatient treatment for surgery, pain control, PT, OT, prophylactic antibiotics, VTE prophylaxis, progressive ambulation and ADL's and discharge planning.The patient is planning to be discharged home.    Anastasio Auerbach Tamecka Milham   PA-C  03/19/2017, 10:11 AM

## 2017-03-19 NOTE — Anesthesia Procedure Notes (Addendum)
Spinal  Patient location during procedure: OR Start time: 03/19/2017 11:35 AM End time: 03/19/2017 11:37 AM Staffing Anesthesiologist: Cecile Hearingurk, Stephen Edward, MD Performed: anesthesiologist  Preanesthetic Checklist Completed: patient identified, surgical consent, pre-op evaluation, timeout performed, IV checked, risks and benefits discussed and monitors and equipment checked Spinal Block Patient position: sitting Prep: site prepped and draped and DuraPrep Patient monitoring: continuous pulse ox and blood pressure Approach: midline Location: L3-4 Injection technique: single-shot Needle Needle type: Pencan and Sprotte  Needle gauge: 24 G Assessment Sensory level: T8 Additional Notes Functioning IV was confirmed and monitors were applied. Sterile prep and drape, including hand hygiene, mask and sterile gloves were used. The patient was positioned and the spine was prepped. The skin was anesthetized with lidocaine.  Free flow of clear CSF was obtained prior to injecting local anesthetic into the CSF.  The spinal needle aspirated freely following injection.  The needle was carefully withdrawn.  The patient tolerated the procedure well. Consent was obtained prior to procedure with all questions answered and concerns addressed. Risks including but not limited to bleeding, infection, nerve damage, paralysis, failed block, inadequate analgesia, allergic reaction, high spinal, itching and headache were discussed and the patient wished to proceed.   Arrie AranStephen Turk, MD

## 2017-03-19 NOTE — Op Note (Signed)
NAME:  Luis Nunez                ACCOUNT NO.: [id number]      MEDICAL RECORD NO.: [id number]      FACILITY:  [facility]      PHYSICIAN:  Shelda Pal  DATE OF BIRTH:  05-03-1955     DATE OF PROCEDURE:  03/19/2017                                 OPERATIVE REPORT         PREOPERATIVE DIAGNOSIS: Left  hip osteoarthritis.      POSTOPERATIVE DIAGNOSIS:  Left hip osteoarthritis.      PROCEDURE:  Left total hip replacement through an anterior approach   utilizing DePuy THR system, component size 56mm pinnacle cup, a size 36+4 neutral   Altrex liner, a size 5 Hi Tri Lock stem with a 36+5 delta ceramic   ball.      SURGEON:  Madlyn Frankel. Charlann Boxer, M.D.      ASSISTANT:  Skip Mayer, PA-C     ANESTHESIA:  Spinal.      SPECIMENS:  None.      COMPLICATIONS:  None.      BLOOD LOSS:  400 cc     DRAINS:  None.      INDICATION OF THE PROCEDURE:  Luis Nunez is a 62 y.o. male who had   presented to office for evaluation of left hip pain.  Radiographs revealed   progressive degenerative changes with bone-on-bone   articulation to the  hip joint.  The patient had painful limited range of   motion significantly affecting their overall quality of life.  The patient was failing to    respond to conservative measures, and at this point was ready   to proceed with more definitive measures.  The patient has noted progressive   degenerative changes in his hip, progressive problems and dysfunction   with regarding the hip prior to surgery.  Consent was obtained for   benefit of pain relief.  Specific risk of infection, DVT, component   failure, dislocation, need for revision surgery, as well discussion of   the anterior versus posterior approach were reviewed.  Consent was   obtained for benefit of anterior pain relief through an anterior   approach.      PROCEDURE IN DETAIL:  The patient was brought to operative theater.   Once adequate anesthesia, preoperative  antibiotics, 2 gm of Ancef, 1 gm of Tranexamic Acid, and 10 mg of Decadron administered.   The patient was positioned supine on the OSI Hanna table.  Once adequate   padding of boney process was carried out, we had predraped out the hip, and  used fluoroscopy to confirm orientation of the pelvis and position.      The left hip was then prepped and draped from proximal iliac crest to   mid thigh with shower curtain technique.      Time-out was performed identifying the patient, planned procedure, and   extremity.     An incision was then made 2 cm distal and lateral to the   anterior superior iliac spine extending over the orientation of the   tensor fascia lata muscle and sharp dissection was carried down to the   fascia of the muscle and protractor placed in the soft tissues.      The fascia was  then incised.  The muscle belly was identified and swept   laterally and retractor placed along the superior neck.  Following   cauterization of the circumflex vessels and removing some pericapsular   fat, a second cobra retractor was placed on the inferior neck.  A third   retractor was placed on the anterior acetabulum after elevating the   anterior rectus.  A L-capsulotomy was along the line of the   superior neck to the trochanteric fossa, then extended proximally and   distally.  Tag sutures were placed and the retractors were then placed   intracapsular.  We then identified the trochanteric fossa and   orientation of my neck cut, confirmed this radiographically   and then made a neck osteotomy with the femur on traction.  The femoral   head was removed without difficulty or complication.  Traction was let   off and retractors were placed posterior and anterior around the   acetabulum.      The labrum and foveal tissue were debrided.  I began reaming with a 46mm   reamer and reamed up to 55mm reamer with good bony bed preparation and a 56mm cup was chosen.  The final 56mm Pinnacle cup  was then impacted under fluoroscopy  to confirm the depth of penetration and orientation with respect to   abduction.  A screw was placed followed by the hole eliminator.  The final   36+4 neutral Altrex liner was impacted with good visualized rim fit.  The cup was positioned anatomically within the acetabular portion of the pelvis.      At this point, the femur was rolled at 80 degrees.  Further capsule was   released off the inferior aspect of the femoral neck.  I then   released the superior capsule proximally.  The hook was placed laterally   along the femur and elevated manually and held in position with the bed   hook.  The leg was then extended and adducted with the leg rolled to 100   degrees of external rotation.  Once the proximal femur was fully   exposed, I used a box osteotome to set orientation.  I then began   broaching with the starting chili pepper broach and passed this by hand and then broached up to 5.  With the 5 broach in place I chose a high offset neck and did several trial reductions.  I then replaced all   retractors and positioned the right hip in the extended and abducted position.  The final 5 Hi Tri Lock stem was   chosen and it was impacted down to the level of neck cut.  Based on this   and the trial reduction, a 36+5 delta ceramic ball was chosen and   impacted onto a clean and dry trunnion, and the hip was reduced.  The   hip had been irrigated throughout the case again at this point.  I did   reapproximate the superior capsular leaflet to the anterior leaflet   using #1 Vicryl.  The fascia of the   tensor fascia lata muscle was then reapproximated using #1 Vicryl and #1 Stratafix sutures.  The   remaining wound was closed with 2-0 Vicryl and running 4-0 Monocryl.   The hip was cleaned, dried, and dressed sterilely using Dermabond and   Aquacel dressing.  He was then brought   to recovery room in stable condition tolerating the procedure well.    Skip Mayer, PA-C was  present for the entirety of the case involved from   preoperative positioning, perioperative retractor management, general   facilitation of the case, as well as primary wound closure as assistant.            Madlyn FrankelMatthew D. Charlann Boxerlin, M.D.        03/19/2017 1:13 PM

## 2017-03-19 NOTE — Transfer of Care (Signed)
Immediate Anesthesia Transfer of Care Note  Patient: Luis Nunez  Procedure(s) Performed: LEFT TOTAL HIP ARTHROPLASTY ANTERIOR APPROACH (Left Hip)  Patient Location: PACU  Anesthesia Type:Spinal  Level of Consciousness: awake, drowsy and patient cooperative  Airway & Oxygen Therapy: Patient Spontanous Breathing and Patient connected to face mask oxygen  Post-op Assessment: Report given to RN and Post -op Vital signs reviewed and stable  Post vital signs: stable  Last Vitals:  Vitals:   03/19/17 1004 03/19/17 1345  BP: (!) 173/114 (!) (P) 144/88  Pulse: 68   Resp: 18 16  Temp: 37.3 C 36.8 C  SpO2: 100% (P) 98%    Last Pain:  Vitals:   03/19/17 1345  TempSrc:   PainSc: (P) 0-No pain      Patients Stated Pain Goal: 4 (03/19/17 1002)  Complications: No apparent anesthesia complications

## 2017-03-19 NOTE — Discharge Instructions (Addendum)
INSTRUCTIONS AFTER JOINT REPLACEMENT  ° °o Remove items at home which could result in a fall. This includes throw rugs or furniture in walking pathways °o ICE to the affected joint every three hours while awake for 30 minutes at a time, for at least the first 3-5 days, and then as needed for pain and swelling.  Continue to use ice for pain and swelling. You may notice swelling that will progress down to the foot and ankle.  This is normal after surgery.  Elevate your leg when you are not up walking on it.   °o Continue to use the breathing machine you got in the hospital (incentive spirometer) which will help keep your temperature down.  It is common for your temperature to cycle up and down following surgery, especially at night when you are not up moving around and exerting yourself.  The breathing machine keeps your lungs expanded and your temperature down. ° ° °DIET:  As you were doing prior to hospitalization, we recommend a well-balanced diet. ° °Information on my medicine - ELIQUIS® (apixaban) ° °Why was Eliquis® prescribed for you? °Eliquis® was prescribed for you to reduce the risk of blood clots forming after orthopedic surgery.   ° °What do You need to know about Eliquis®? °Take your Eliquis® TWICE DAILY - one tablet in the morning and one tablet in the evening with or without food.  It would be best to take the dose about the same time each day. ° °If you have difficulty swallowing the tablet whole please discuss with your pharmacist how to take the medication safely. ° °Take Eliquis® exactly as prescribed by your doctor and DO NOT stop taking Eliquis® without talking to the doctor who prescribed the medication.  Stopping without other medication to take the place of Eliquis® may increase your risk of developing a clot. ° °After discharge, you should have regular check-up appointments with your healthcare provider that is prescribing your Eliquis®. ° °What do you do if you miss a dose? °If a dose of  ELIQUIS® is not taken at the scheduled time, take it as soon as possible on the same day and twice-daily administration should be resumed.  The dose should not be doubled to make up for a missed dose.  Do not take more than one tablet of ELIQUIS at the same time. ° °Important Safety Information °A possible side effect of Eliquis® is bleeding. You should call your healthcare provider right away if you experience any of the following: °? Bleeding from an injury or your nose that does not stop. °? Unusual colored urine (red or dark brown) or unusual colored stools (red or black). °? Unusual bruising for unknown reasons. °? A serious fall or if you hit your head (even if there is no bleeding). ° °Some medicines may interact with Eliquis® and might increase your risk of bleeding or clotting while on Eliquis®. To help avoid this, consult your healthcare provider or pharmacist prior to using any new prescription or non-prescription medications, including herbals, vitamins, non-steroidal anti-inflammatory drugs (NSAIDs) and supplements. ° °This website has more information on Eliquis® (apixaban): http://www.eliquis.com/eliquis/home ° °DRESSING / WOUND CARE / SHOWERING ° °Keep the surgical dressing until follow up.  The dressing is water proof, so you can shower without any extra covering.  IF THE DRESSING FALLS OFF or the wound gets wet inside, change the dressing with sterile gauze.  Please use good hand washing techniques before changing the dressing.  Do not use any lotions or   creams on the incision until instructed by your surgeon.   ° °ACTIVITY ° °o Increase activity slowly as tolerated, but follow the weight bearing instructions below.   °o No driving for 6 weeks or until further direction given by your physician.  You cannot drive while taking narcotics.  °o No lifting or carrying greater than 10 lbs. until further directed by your surgeon. °o Avoid periods of inactivity such as sitting longer than an hour when not  asleep. This helps prevent blood clots.  °o You may return to work once you are authorized by your doctor.  ° ° ° °WEIGHT BEARING  ° °Weight bearing as tolerated with assist device (walker, cane, etc) as directed, use it as long as suggested by your surgeon or therapist, typically at least 4-6 weeks. ° ° °EXERCISES ° °Results after joint replacement surgery are often greatly improved when you follow the exercise, range of motion and muscle strengthening exercises prescribed by your doctor. Safety measures are also important to protect the joint from further injury. Any time any of these exercises cause you to have increased pain or swelling, decrease what you are doing until you are comfortable again and then slowly increase them. If you have problems or questions, call your caregiver or physical therapist for advice.  ° °Rehabilitation is important following a joint replacement. After just a few days of immobilization, the muscles of the leg can become weakened and shrink (atrophy).  These exercises are designed to build up the tone and strength of the thigh and leg muscles and to improve motion. Often times heat used for twenty to thirty minutes before working out will loosen up your tissues and help with improving the range of motion but do not use heat for the first two weeks following surgery (sometimes heat can increase post-operative swelling).  ° °These exercises can be done on a training (exercise) mat, on the floor, on a table or on a bed. Use whatever works the best and is most comfortable for you.    Use music or television while you are exercising so that the exercises are a pleasant break in your day. This will make your life better with the exercises acting as a break in your routine that you can look forward to.   Perform all exercises about fifteen times, three times per day or as directed.  You should exercise both the operative leg and the other leg as well. ° °Exercises include: °  °• Quad Sets -  Tighten up the muscle on the front of the thigh (Quad) and hold for 5-10 seconds.   °• Straight Leg Raises - With your knee straight (if you were given a brace, keep it on), lift the leg to 60 degrees, hold for 3 seconds, and slowly lower the leg.  Perform this exercise against resistance later as your leg gets stronger.  °• Leg Slides: Lying on your back, slowly slide your foot toward your buttocks, bending your knee up off the floor (only go as far as is comfortable). Then slowly slide your foot back down until your leg is flat on the floor again.  °• Angel Wings: Lying on your back spread your legs to the side as far apart as you can without causing discomfort.  °• Hamstring Strength:  Lying on your back, push your heel against the floor with your leg straight by tightening up the muscles of your buttocks.  Repeat, but this time bend your knee to a comfortable angle, and   push your heel against the floor.  You may put a pillow under the heel to make it more comfortable if necessary.  ° °A rehabilitation program following joint replacement surgery can speed recovery and prevent re-injury in the future due to weakened muscles. Contact your doctor or a physical therapist for more information on knee rehabilitation.  ° ° °CONSTIPATION ° °Constipation is defined medically as fewer than three stools per week and severe constipation as less than one stool per week.  Even if you have a regular bowel pattern at home, your normal regimen is likely to be disrupted due to multiple reasons following surgery.  Combination of anesthesia, postoperative narcotics, change in appetite and fluid intake all can affect your bowels.  ° °YOU MUST use at least one of the following options; they are listed in order of increasing strength to get the job done.  They are all available over the counter, and you may need to use some, POSSIBLY even all of these options:   ° °Drink plenty of fluids (prune juice may be helpful) and high fiber  foods °Colace 100 mg by mouth twice a day  °Senokot for constipation as directed and as needed Dulcolax (bisacodyl), take with full glass of water  °Miralax (polyethylene glycol) once or twice a day as needed. ° °If you have tried all these things and are unable to have a bowel movement in the first 3-4 days after surgery call either your surgeon or your primary doctor.   ° °If you experience loose stools or diarrhea, hold the medications until you stool forms back up.  If your symptoms do not get better within 1 week or if they get worse, check with your doctor.  If you experience "the worst abdominal pain ever" or develop nausea or vomiting, please contact the office immediately for further recommendations for treatment. ° ° °ITCHING:  If you experience itching with your medications, try taking only a single pain pill, or even half a pain pill at a time.  You can also use Benadryl over the counter for itching or also to help with sleep.  ° °TED HOSE STOCKINGS:  Use stockings on both legs until for at least 2 weeks or as directed by physician office. They may be removed at night for sleeping. ° °MEDICATIONS:  See your medication summary on the “After Visit Summary” that nursing will review with you.  You may have some home medications which will be placed on hold until you complete the course of blood thinner medication.  It is important for you to complete the blood thinner medication as prescribed. ° °PRECAUTIONS:  If you experience chest pain or shortness of breath - call 911 immediately for transfer to the hospital emergency department.  ° °If you develop a fever greater that 101 F, purulent drainage from wound, increased redness or drainage from wound, foul odor from the wound/dressing, or calf pain - CONTACT YOUR SURGEON.   °                                                °FOLLOW-UP APPOINTMENTS:  If you do not already have a post-op appointment, please call the office for an appointment to be seen by your  surgeon.  Guidelines for how soon to be seen are listed in your “After Visit Summary”, but are typically between 1-4 weeks   after surgery. ° °OTHER INSTRUCTIONS:  ° °Knee Replacement:  Do not place pillow under knee, focus on keeping the knee straight while resting.  ° °MAKE SURE YOU:  °• Understand these instructions.  °• Get help right away if you are not doing well or get worse.  ° ° °Thank you for letting us be a part of your medical care team.  It is a privilege we respect greatly.  We hope these instructions will help you stay on track for a fast and full recovery!  °  °

## 2017-03-19 NOTE — Interval H&P Note (Signed)
History and Physical Interval Note:  03/19/2017 11:19 AM  Luis PlyJerry Cosio  has presented today for surgery, with the diagnosis of LEFT hip osteoarthritis  The various methods of treatment have been discussed with the patient and family. After consideration of risks, benefits and other options for treatment, the patient has consented to  Procedure(s) with comments: LEFT TOTAL HIP ARTHROPLASTY ANTERIOR APPROACH (Left) - 70 mins as a surgical intervention .  The patient's history has been reviewed, patient examined, no change in status, stable for surgery.  I have reviewed the patient's chart and labs.  Questions were answered to the patient's satisfaction.     Shelda PalMatthew D Latise Dilley

## 2017-03-19 NOTE — Anesthesia Preprocedure Evaluation (Addendum)
Anesthesia Evaluation  Patient identified by MRN, date of birth, ID band Patient awake    Reviewed: Allergy & Precautions, NPO status , Patient's Chart, lab work & pertinent test results, reviewed documented beta blocker date and time   Airway Mallampati: II  TM Distance: >3 FB Neck ROM: Full    Dental  (+) Teeth Intact, Dental Advisory Given   Pulmonary neg pulmonary ROS,    Pulmonary exam normal breath sounds clear to auscultation       Cardiovascular Exercise Tolerance: Good hypertension, Pt. on medications and Pt. on home beta blockers + dysrhythmias Atrial Fibrillation  Rhythm:Irregular Rate:Normal  EKG: A-flutter, RBBB  Echo 12.19.18: Study Conclusions  - Left ventricle: The cavity size was normal. Wall thickness was increased in a pattern of moderate LVH. Systolic function was normal. The estimated ejection fraction was in the range of 60% to 65%. Wall motion was normal; there were no regional wall motion abnormalities. The study is not technically sufficient to allow evaluation of LV diastolic function. - Aortic valve: Trileaflet; mildly thickened leaflets. There was mild regurgitation. - Mitral valve: There was mild regurgitation. - Left atrium: The atrium was moderately dilated. - Right ventricle: Systolic function was mildly reduced. - Tricuspid valve: There was mild regurgitation.   Neuro/Psych negative neurological ROS  negative psych ROS   GI/Hepatic negative GI ROS, Neg liver ROS,   Endo/Other  negative endocrine ROSObesity   Renal/GU Renal InsufficiencyRenal disease     Musculoskeletal  (+) Arthritis , Osteoarthritis,    Abdominal   Peds  Hematology  (+) Blood dyscrasia (Eliquis), , Plt 306k on 03/15/17 Eliquis last taken 03/16/17 AM. >72 hours since last dose.   Anesthesia Other Findings Day of surgery medications reviewed with the patient.  Reproductive/Obstetrics                            Anesthesia Physical Anesthesia Plan  ASA: III  Anesthesia Plan: Spinal and MAC   Post-op Pain Management:    Induction:   PONV Risk Score and Plan: 1 and Propofol infusion, Ondansetron, Treatment may vary due to age or medical condition and Midazolam  Airway Management Planned: Simple Face Mask  Additional Equipment:   Intra-op Plan:   Post-operative Plan:   Informed Consent: I have reviewed the patients History and Physical, chart, labs and discussed the procedure including the risks, benefits and alternatives for the proposed anesthesia with the patient or authorized representative who has indicated his/her understanding and acceptance.   Dental advisory given  Plan Discussed with: CRNA, Anesthesiologist and Surgeon  Anesthesia Plan Comments: (Discussed risks and benefits of and differences between spinal and general. Discussed risks of spinal including headache, backache, failure, bleeding, infection, and nerve damage. Patient consents to spinal. Questions answered. Coagulation studies and platelet count acceptable.)        Anesthesia Quick Evaluation

## 2017-03-19 NOTE — Anesthesia Postprocedure Evaluation (Signed)
Anesthesia Post Note  Patient: Luis Nunez  Procedure(s) Performed: LEFT TOTAL HIP ARTHROPLASTY ANTERIOR APPROACH (Left Hip)     Patient location during evaluation: Endoscopy Anesthesia Type: Spinal Level of consciousness: awake and alert Pain management: pain level controlled Vital Signs Assessment: post-procedure vital signs reviewed and stable Respiratory status: nonlabored ventilation, respiratory function stable and spontaneous breathing Cardiovascular status: stable and blood pressure returned to baseline Postop Assessment: no apparent nausea or vomiting, spinal receding, no backache, patient able to bend at knees and no headache Anesthetic complications: no    Last Vitals:  Vitals:   03/19/17 1004 03/19/17 1345  BP: (!) 173/114 (!) 144/88  Pulse: 68   Resp: 18 16  Temp: 37.3 C 36.8 C  SpO2: 100% 98%    Last Pain:  Vitals:   03/19/17 1345  TempSrc:   PainSc: 0-No pain    LLE Motor Response: No movement due to regional block (03/19/17 1345) LLE Sensation: No sensation (absent) (03/19/17 1345) RLE Motor Response: No movement due to regional block (03/19/17 1345) RLE Sensation: No sensation (absent) (03/19/17 1345) L Sensory Level: T12-Inguinal (groin) region (03/19/17 1345) R Sensory Level: T12-Inguinal (groin) region (03/19/17 1345)  Cecile HearingStephen Edward Ivann Trimarco

## 2017-03-20 ENCOUNTER — Encounter (HOSPITAL_COMMUNITY): Payer: Self-pay | Admitting: Orthopedic Surgery

## 2017-03-20 DIAGNOSIS — E669 Obesity, unspecified: Secondary | ICD-10-CM | POA: Diagnosis present

## 2017-03-20 LAB — CBC
HCT: 28 % — ABNORMAL LOW (ref 39.0–52.0)
HEMOGLOBIN: 9.7 g/dL — AB (ref 13.0–17.0)
MCH: 31.7 pg (ref 26.0–34.0)
MCHC: 34.6 g/dL (ref 30.0–36.0)
MCV: 91.5 fL (ref 78.0–100.0)
Platelets: 215 10*3/uL (ref 150–400)
RBC: 3.06 MIL/uL — AB (ref 4.22–5.81)
RDW: 13.3 % (ref 11.5–15.5)
WBC: 11.6 10*3/uL — ABNORMAL HIGH (ref 4.0–10.5)

## 2017-03-20 LAB — BASIC METABOLIC PANEL
Anion gap: 6 (ref 5–15)
BUN: 26 mg/dL — AB (ref 6–20)
CHLORIDE: 103 mmol/L (ref 101–111)
CO2: 25 mmol/L (ref 22–32)
CREATININE: 1.29 mg/dL — AB (ref 0.61–1.24)
Calcium: 8.2 mg/dL — ABNORMAL LOW (ref 8.9–10.3)
GFR calc Af Amer: >60 mL/min (ref >60)
GFR calc non Af Amer: 58 mL/min — ABNORMAL LOW (ref >60)
GLUCOSE: 149 mg/dL — AB (ref 65–99)
POTASSIUM: 4.1 mmol/L (ref 3.5–5.1)
SODIUM: 134 mmol/L — AB (ref 135–145)

## 2017-03-20 MED ORDER — LISINOPRIL 20 MG PO TABS
20.0000 mg | ORAL_TABLET | Freq: Every day | ORAL | Status: AC
  Administered 2017-03-20: 20 mg via ORAL
  Filled 2017-03-20: qty 1

## 2017-03-20 MED ORDER — HYDROCHLOROTHIAZIDE 12.5 MG PO CAPS
12.5000 mg | ORAL_CAPSULE | Freq: Every day | ORAL | Status: AC
  Administered 2017-03-20: 15:00:00 12.5 mg via ORAL
  Filled 2017-03-20: qty 1

## 2017-03-20 NOTE — Evaluation (Deleted)
Occupational Therapy Evaluation Patient Details Name: Luis Nunez MRN: [id number] DOB: March 11, 1955 Today's Date: 03/20/2017    History of Present Illness LTHA, direct anterior. will have right THA in future   Clinical Impression   Pt is s/p THA resulting in the deficits listed below (see OT Problem List).  Pt will benefit from skilled OT to increase their safety and independence with ADL and functional mobility for ADL to facilitate discharge to venue listed below. *       Follow Up Recommendations  No OT follow up    Equipment Recommendations  None recommended by OT    Recommendations for Other Services       Precautions / Restrictions Precautions Precautions: Fall Restrictions Weight Bearing Restrictions: No      Mobility Bed Mobility Overal bed mobility: Needs Assistance Bed Mobility: Sit to Supine     Supine to sit: Supervision Sit to supine: Min assist   General bed mobility comments: extra  time, self assist left leg  Transfers Overall transfer level: Needs assistance Equipment used: Rolling walker (2 wheeled) Transfers: Sit to/from [employer] Sit to Stand: Min assist Stand pivot transfers: Min assist       General transfer comment: cues for hand and left leg position        ADL either performed or assessed with clinical judgement   ADL Overall ADL's : Needs assistance/impaired Eating/Feeding: Set up;Sitting   Grooming: Set up;Sitting   Upper Body Bathing: Set up;Sitting   Lower Body Bathing: Minimal assistance;Sit to/from stand;Cueing for safety;Cueing for sequencing;Cueing for compensatory techniques       Lower Body Dressing: Moderate assistance;Sit to/from stand;Cueing for compensatory techniques;Cueing for sequencing   Toilet Transfer: Minimal assistance;BSC;RW   Toileting- Clothing Manipulation and Hygiene: Minimal assistance;Sit to/from stand;Cueing for safety;Cueing for sequencing         General ADL  Comments: pt became dizzy and wanted to return to supine.  Pts BP 141/68.  RN aware. Will see pt for a 2nd OT session to complete education     Vision Patient Visual Report: No change from baseline              Pertinent Vitals/Pain Pain Assessment: 0-10 Pain Score: 3  Faces Pain Scale: Hurts a little bit Pain Location: r hip Pain Descriptors / Indicators: Sore Pain Intervention(s): Limited activity within patient's tolerance;Repositioned     Hand Dominance     Extremity/Trunk Assessment Upper Extremity Assessment Upper Extremity Assessment: Overall WFL for tasks assessed   Lower Extremity Assessment LLE Deficits / Details: advances the leg   Cervical / Trunk Assessment Cervical / Trunk Assessment: Normal   Communication Communication Communication: No difficulties   Cognition Arousal/Alertness: Awake/alert Behavior During Therapy: WFL for tasks assessed/performed Overall Cognitive Status: Within Functional Limits for tasks assessed                                     General Comments          Shoulder Instructions      Home Living Family/patient expects to be discharged to:: Private residence Living Arrangements: Non-relatives/Friends Available Help at Discharge: Friend(s) Type of Home: House Home Access: Level entry     Home Layout: One level         Bathroom Toilet: Handicapped height     Home Equipment: Environmental consultant - 2 wheels   Additional Comments: plans to stay with a friend until  next week.       Prior Functioning/Environment Level of Independence: Independent                 OT Problem List: Decreased strength;Decreased activity tolerance;Decreased safety awareness;Decreased knowledge of use of DME or AE      OT Treatment/Interventions: Self-care/ADL training;Patient/family education;DME and/or AE instruction    OT Goals(Current goals can be found in the care plan section) Acute Rehab OT Goals Patient Stated Goal: to  get the other hip fixed OT Goal Formulation: With patient Time For Goal Achievement: 03/27/17 Potential to Achieve Goals: Good  OT Frequency: Min 2X/week   Barriers to D/C:               AM-PAC PT "6 Clicks" Daily Activity     Outcome Measure Help from another person eating meals?: None Help from another person taking care of personal grooming?: None Help from another person toileting, which includes using toliet, bedpan, or urinal?: A Little Help from another person bathing (including washing, rinsing, drying)?: None Help from another person to put on and taking off regular upper body clothing?: None Help from another person to put on and taking off regular lower body clothing?: A Little 6 Click Score: 22   End of Session Equipment Utilized During Treatment: Rolling walker Nurse Communication: Mobility status  Activity Tolerance: Other (comment)(limited by some dizziness and need to lie down) Patient left: in bed;with call bell/phone within reach  OT Visit Diagnosis: Unsteadiness on feet (R26.81);Muscle weakness (generalized) (M62.81);Pain Pain - Right/Left: Right Pain - part of body: Hip                Time: [id number] OT Time Calculation (min): 32 min Charges:  OT General Charges $OT Visit: 1 Visit OT Evaluation $OT Eval Low Complexity: 1 Low OT Treatments $Self Care/Home Management : 8-22 mins G-Codes:     Lise AuerLori Ezzard Ditmer, OT [phone]  Einar CrowEDDING, Markiya Keefe D 03/20/2017, 11:44 AM

## 2017-03-20 NOTE — Evaluation (Signed)
Occupational Therapy Evaluation Patient Details Name: Luis Nunez MRN: [id number] DOB: 1956-01-10 Today's Date: 03/20/2017    History of Present Illness LTHA, direct anterior. will have right THA in future   Clinical Impression   OT education complete    Follow Up Recommendations  No OT follow up    Equipment Recommendations  None recommended by OT       Precautions / Restrictions Precautions Precautions: Fall Restrictions Weight Bearing Restrictions: No      Mobility Bed Mobility Overal bed mobility: Needs Assistance Bed Mobility: Sit to Supine     Supine to sit: Supervision Sit to supine: Min assist   General bed mobility comments: pt in chair  Transfers Overall transfer level: Needs assistance Equipment used: Rolling walker (2 wheeled) Transfers: Sit to/from UGI CorporationStand;Stand Pivot Transfers Sit to Stand: Supervision Stand pivot transfers: Supervision       General transfer comment: cues for hand and left leg position        ADL either performed or assessed with clinical judgement   ADL Overall ADL's : Needs assistance/impaired Eating/Feeding: Set up;Sitting   Grooming: Set up;Sitting   Upper Body Bathing: Set up;Sitting   Lower Body Bathing: Sit to/from stand;Cueing for safety;Cueing for sequencing;Cueing for compensatory techniques;Supervison/ safety   Upper Body Dressing : Set up;Sitting   Lower Body Dressing: Sit to/from stand;Cueing for compensatory techniques;Cueing for sequencing;Min guard   Toilet Transfer: Minimal assistance;RW;Comfort height toilet   Toileting- Clothing Manipulation and Hygiene: Sit to/from stand;Cueing for safety;Cueing for sequencing;Supervision/safety   Tub/ Shower Transfer: Tub transfer;Min guard   Functional mobility during ADLs: Supervision/safety;Rolling walker      Vision Patient Visual Report: No change from baseline              Pertinent Vitals/Pain Pain Assessment: 0-10 Pain Score: 3  Faces Pain  Scale: Hurts a little bit Pain Location: r hip Pain Descriptors / Indicators: Sore Pain Intervention(s): Limited activity within patient's tolerance     Hand Dominance     Extremity/Trunk Assessment Upper Extremity Assessment Upper Extremity Assessment: Overall WFL for tasks assessed   Lower Extremity Assessment LLE Deficits / Details: advances the leg   Cervical / Trunk Assessment Cervical / Trunk Assessment: Normal   Communication Communication Communication: No difficulties   Cognition Arousal/Alertness: Awake/alert Behavior During Therapy: WFL for tasks assessed/performed Overall Cognitive Status: Within Functional Limits for tasks assessed                                                Home Living Family/patient expects to be discharged to:: Private residence Living Arrangements: Non-relatives/Friends Available Help at Discharge: Friend(s) Type of Home: House Home Access: Level entry     Home Layout: One level         Bathroom Toilet: Handicapped height     Home Equipment: Environmental consultantWalker - 2 wheels   Additional Comments: plans to stay with a friend until next week.       Prior Functioning/Environment Level of Independence: Independent                 OT Problem List: Decreased strength;Decreased activity tolerance;Decreased safety awareness;Decreased knowledge of use of DME or AE      OT Treatment/Interventions: Self-care/ADL training;Patient/family education;DME and/or AE instruction    OT Goals(Current goals can be found in the care plan section) Acute Rehab OT Goals Patient  Stated Goal: to get the other hip fixed OT Goal Formulation: With patient Time For Goal Achievement: 03/27/17 Potential to Achieve Goals: Good  OT Frequency: Min 2X/week   Barriers to D/C:               AM-PAC PT "6 Clicks" Daily Activity     Outcome Measure Help from another person eating meals?: None Help from another person taking care of personal  grooming?: None Help from another person toileting, which includes using toliet, bedpan, or urinal?: None Help from another person bathing (including washing, rinsing, drying)?: A Little Help from another person to put on and taking off regular upper body clothing?: None Help from another person to put on and taking off regular lower body clothing?: A Little 6 Click Score: 22   End of Session Equipment Utilized During Treatment: Rolling walker Nurse Communication: Mobility status  Activity Tolerance: Patient tolerated treatment well Patient left: in chair  OT Visit Diagnosis: Unsteadiness on feet (R26.81);Muscle weakness (generalized) (M62.81);Pain Pain - Right/Left: Right Pain - part of body: Hip                Time: 1025-1050 OT Time Calculation (min): 25 min Charges:  OT General Charges $OT Visit: 1 Visit OT Evaluation $OT Eval Low Complexity: 1 Low OT Treatments $Self Care/Home Management : 8-22 mins G-Codes:     Lise Auer, OT [phone]  Einar Crow D 03/20/2017, 11:53 AM

## 2017-03-20 NOTE — Evaluation (Signed)
Physical Therapy Evaluation Patient Details Name: Luis Nunez MRN: [id number] DOB: [birth year] Today's Date: 03/20/2017   History of Present Illness  LTHA, direct anterior. will have right THA in future  Clinical Impression  The patient is progressing well. Plans Dc today after PT. Pt admitted with above diagnosis. Pt currently with functional limitations due to the deficits listed below (see PT Problem List).  Pt will benefit from skilled PT to increase their independence and safety with mobility to allow discharge to the venue listed below.     Follow Up Recommendations No PT follow up;DC plan and follow up therapy as arranged by surgeon    Equipment Recommendations  None recommended by PT    Recommendations for Other Services       Precautions / Restrictions Precautions Precautions: Fall Restrictions Weight Bearing Restrictions: No      Mobility  Bed Mobility Overal bed mobility: Needs Assistance Bed Mobility: Supine to Sit     Supine to sit: Supervision     General bed mobility comments: extra  time, self assist left leg  Transfers Overall transfer level: Needs assistance Equipment used: Rolling walker (2 wheeled) Transfers: Sit to/from Stand Sit to Stand: Min guard         General transfer comment: cues for hand and left leg position  Ambulation/Gait Ambulation/Gait assistance: Min guard Ambulation Distance (Feet): 400 Feet Assistive device: Rolling walker (2 wheeled) Gait Pattern/deviations: Step-to pattern;Step-through pattern;Trunk flexed     General Gait Details: frequent cues for more erect posture, to externally rotate the left leg more  Stairs            Wheelchair Mobility    Modified Rankin (Stroke Patients Only)       Balance                                             Pertinent Vitals/Pain Pain Assessment: 0-10 Pain Score: 2  Faces Pain Scale: Hurts a little bit Pain Descriptors / Indicators: Sore     Home Living Family/patient expects to be discharged to:: Private residence Living Arrangements: Non-relatives/Friends Available Help at Discharge: Friend(s) Type of Home: House Home Access: Level entry     Home Layout: One level Home Equipment: Environmental consultantWalker - 2 wheels Additional Comments: plans to stay with a friend until next week.     Prior Function Level of Independence: Independent               Hand Dominance        Extremity/Trunk Assessment        Lower Extremity Assessment LLE Deficits / Details: advances the leg    Cervical / Trunk Assessment Cervical / Trunk Assessment: Normal  Communication   Communication: No difficulties  Cognition Arousal/Alertness: Awake/alert Behavior During Therapy: WFL for tasks assessed/performed Overall Cognitive Status: Within Functional Limits for tasks assessed                                        General Comments      Exercises Total Joint Exercises Ankle Circles/Pumps: AROM;Both Quad Sets: AROM;Both;10 reps Gluteal Sets: 10 reps Towel Squeeze: 10 reps Short Arc Quad: AROM;Left;10 reps Heel Slides: AAROM;Left;10 reps Hip ABduction/ADduction: AAROM;Left;10 reps   Assessment/Plan    PT Assessment Patient needs continued PT services  PT Problem List Decreased range of motion;Decreased strength;Decreased mobility;Decreased activity tolerance;Decreased knowledge of use of DME;Decreased knowledge of precautions;Pain       PT Treatment Interventions Functional mobility training;DME instruction;Gait training;Therapeutic activities;Therapeutic exercise;Patient/family education    PT Goals (Current goals can be found in the Care Plan section)  Acute Rehab PT Goals Patient Stated Goal: to get the other hip fixed PT Goal Formulation: With patient Time For Goal Achievement: 03/22/17 Potential to Achieve Goals: Good    Frequency 7X/week   Barriers to discharge        Co-evaluation                AM-PAC PT "6 Clicks" Daily Activity  Outcome Measure Difficulty turning over in bed (including adjusting bedclothes, sheets and blankets)?: None Difficulty moving from lying on back to sitting on the side of the bed? : A Little Difficulty sitting down on and standing up from a chair with arms (e.g., wheelchair, bedside commode, etc,.)?: A Little Help needed moving to and from a bed to chair (including a wheelchair)?: A Little Help needed walking in hospital room?: A Little Help needed climbing 3-5 steps with a railing? : A Little 6 Click Score: 19    End of Session   Activity Tolerance: Patient tolerated treatment well Patient left: in chair;with call bell/phone within reach Nurse Communication: Mobility status PT Visit Diagnosis: Difficulty in walking, not elsewhere classified (R26.2);Pain Pain - Right/Left: Left Pain - part of body: Hip    Time: 4098-1191 PT Time Calculation (min) (ACUTE ONLY): 32 min   Charges:   PT Evaluation $PT Eval Low Complexity: 1 Low PT Treatments $Gait Training: 8-22 mins   PT G CodesBlanchard Kelch PT [phone] e}  Rada Hay 03/20/2017, 11:05 AM

## 2017-03-20 NOTE — Progress Notes (Signed)
     Subjective: 1 Day Post-Op Procedure(s) (LRB): LEFT TOTAL HIP ARTHROPLASTY ANTERIOR APPROACH (Left)   Patient reports pain as mild, pain controlled. No events throughout the night.  Discussed the procedure and answered questions.  Ready to be discharged home.  Objective:   VITALS:   Vitals:   03/20/17 0140 03/20/17 0556  BP: (!) 142/81 115/71  Pulse: 88 63  Resp: 16 13  Temp: 98.5 F (36.9 C) 98 F (36.7 C)  SpO2: 100% 100%    Dorsiflexion/Plantar flexion intact Incision: dressing C/D/I No cellulitis present Compartment soft  LABS Recent Labs    03/20/17 0526  HGB 9.7*  HCT 28.0*  WBC 11.6*  PLT 215    Recent Labs    03/20/17 0526  NA 134*  K 4.1  BUN 26*  CREATININE 1.29*  GLUCOSE 149*     Assessment/Plan: 1 Day Post-Op Procedure(s) (LRB): LEFT TOTAL HIP ARTHROPLASTY ANTERIOR APPROACH (Left) Foley cath d/c'ed Advance diet Up with therapy D/C IV fluids Dis[facility] home [facility] Follow up with OLIN,Hooria Gasparini D in 2 weeks.  Contact information:  [facility] [address] [place] [id number] [phone]    Obese (BMI 30-39.9) Estimated body mass index is 30.42 kg/m as calculated from the following:   Height as of this encounter: 5\' 10"  (1.778 m).   Weight as of this encounter: 96.2 kg (212 lb). Patient also counseled that weight may inhibit the healing process Patient counseled that losing weight will help with future health issues        Anastasio AuerbachMatthew S. December Hedtke   PAC  03/20/2017, 9:57 AM

## 2017-03-20 NOTE — Progress Notes (Signed)
Physical Therapy Treatment Patient Details Name: Luis Nunez MRN: [id number] DOB: 03/03/1956 Today's Date: 03/20/2017    History of Present Illness LTHA, direct anterior. will have right THA in future    PT Comments    Ready for DC  Follow Up Recommendations  No PT follow up;DC plan and follow up therapy as arranged by surgeon     Equipment Recommendations  None recommended by PT    Recommendations for Other Services       Precautions / Restrictions Precautions Precautions: Fall    Mobility  Bed Mobility Overal bed mobility: Needs Assistance Bed Mobility: Sit to Supine       Sit to supine: Min assist   General bed mobility comments: pt in chair  Transfers Overall transfer level: Needs assistance Equipment used: Rolling walker (2 wheeled) Transfers: Sit to/from Stand Sit to Stand: Supervision Stand pivot transfers: Supervision       General transfer comment: cues for hand and left leg position  Ambulation/Gait Ambulation/Gait assistance: Supervision Ambulation Distance (Feet): 400 Feet Assistive device: Rolling walker (2 wheeled) Gait Pattern/deviations: Step-to pattern;Step-through pattern     General Gait Details: frequent cues for more erect posture, to externally rotate the left leg more   Stairs            Wheelchair Mobility    Modified Rankin (Stroke Patients Only)       Balance                                            Cognition Arousal/Alertness: Awake/alert Behavior During Therapy: WFL for tasks assessed/performed Overall Cognitive Status: Within Functional Limits for tasks assessed                                        Exercises Total Joint Exercises Long Arc Quad: AROM;Left;10 reps;Seated Marching in Standing: Seated;Left;10 reps;AAROM    General Comments        Pertinent Vitals/Pain Pain Score: 3  Pain Location: r hip Pain Descriptors / Indicators: Sore Pain  Intervention(s): Monitored during session;Premedicated before session;Ice applied    Home Living Family/patient expects to be discharged to:: Private residence Living Arrangements: Non-relatives/Friends Available Help at Discharge: Friend(s) Type of Home: House Home Access: Level entry   Home Layout: One level Home Equipment: Environmental consultantWalker - 2 wheels Additional Comments: plans to stay with a friend until next week.     Prior Function Level of Independence: Independent          PT Goals (current goals can now be found in the care plan section) Acute Rehab PT Goals Patient Stated Goal: to get the other hip fixed Progress towards PT goals: Progressing toward goals    Frequency           PT Plan Current plan remains appropriate    Co-evaluation              AM-PAC PT "6 Clicks" Daily Activity  Outcome Measure  Difficulty turning over in bed (including adjusting bedclothes, sheets and blankets)?: None Difficulty moving from lying on back to sitting on the side of the bed? : None Difficulty sitting down on and standing up from a chair with arms (e.g., wheelchair, bedside commode, etc,.)?: None Help needed moving to and from a bed to chair (including a  wheelchair)?: None Help needed walking in hospital room?: A Little Help needed climbing 3-5 steps with a railing? : A Little 6 Click Score: 22    End of Session   Activity Tolerance: Patient tolerated treatment well Patient left: in chair;with call bell/phone within reach Nurse Communication: Mobility status       Time: 7829-5621 PT Time Calculation (min) (ACUTE ONLY): 16 min  Charges:  $Gait Training: 8-22 mins                    G Codes:       {   Rada Hay 03/20/2017, 3:16 PM

## 2017-03-22 ENCOUNTER — Other Ambulatory Visit: Payer: Self-pay | Admitting: Cardiovascular Disease

## 2017-03-22 MED ORDER — METOPROLOL TARTRATE 25 MG PO TABS: 25.0000 mg | ORAL_TABLET | Freq: Two times a day (BID) | ORAL | 6 refills | Status: DC

## 2017-03-22 NOTE — Telephone Encounter (Signed)
Done

## 2017-03-22 NOTE — Telephone Encounter (Signed)
°*  STAT* If patient is at the pharmacy, call can be transferred to refill team. ° ° °1. Which medications need to be refilled?metoprolol tartrate (LOPRESSOR) 25 MG tablet  ° ° °2. Which pharmacy/location (including street and city if local pharmacy) is medication to be sent to?  [facility] ° °3. Do they need a 30 day or 90 day supply?  ° °

## 2017-03-25 NOTE — Discharge Summary (Signed)
Physician Discharge Summary  Patient ID: Luis Nunez MRN: [id number] DOB/AGE: 10-Apr-1955 62 y.o.  Admit date: 03/19/2017 Discharge date: 03/20/2017   Procedures:  Procedure(s) (LRB): LEFT TOTAL HIP ARTHROPLASTY ANTERIOR APPROACH (Left)  Attending Physician:  Dr. Durene RomansMatthew Olin   Admission Diagnoses:   Left hip primary OA / pain  Discharge Diagnoses:  Principal Problem:   S/P left THA, AA Active Problems:   Obese  Past Medical History:  Diagnosis Date  . Dysrhythmia 02/2017   A flutter  . Hypertension     HPI:    Luis PlyJerry Brecheen, 62 y.o. male, has a history of pain and functional disability in the left hip(s) due to arthritis and patient has failed non-surgical conservative treatments for greater than 12 weeks to include NSAID's and/or analgesics and activity modification.  Onset of symptoms was gradual starting 14-15 years ago with gradually worsening course since that time.The patient noted no past surgery on the left hip(s).  Patient currently rates pain in the left hip at 9 out of 10 with activity. Patient has night pain, worsening of pain with activity and weight bearing, trendelenberg gait, pain that interfers with activities of daily living and pain with passive range of motion. Patient has evidence of periarticular osteophytes and joint space narrowing by imaging studies. This condition presents safety issues increasing the risk of falls. There is no current active infection.  Risks, benefits and expectations were discussed with the patient.  Risks including but not limited to the risk of anesthesia, blood clots, nerve damage, blood vessel damage, failure of the prosthesis, infection and up to and including death.  Patient understand the risks, benefits and expectations and wishes to proceed with surgery.   PCP: Louie Bostonapper, David B., MD   Discharged Condition: good  Hospital Course:  Patient underwent the above stated procedure on 03/19/2017. Patient tolerated the procedure well  and brought to the recovery room in good condition and subsequently to the floor.  POD #1 BP: 115/71 ; Pulse: 63 ; Temp: 98 F (36.7 C) ; Resp: 13 Patient reports pain as mild, pain controlled. No events throughout the night.  Discussed the procedure and answered questions.  Ready to be discharged home. Dorsiflexion/plantar flexion intact, incision: dressing C/D/I, no cellulitis present and compartment soft.   LABS  Basename    HGB     9.7  HCT     28.0    Discharge Exam: General appearance: alert, cooperative and no distress Extremities: Homans sign is negative, no sign of DVT, no edema, redness or tenderness in the calves or thighs and no ulcers, gangrene or trophic changes  Disposition: Home with follow up in 2 weeks   Follow-up Information    Durene Romanslin, Baldwin Racicot, MD Follow up.   Specialty:  Orthopedic Surgery Contact information: [address] [place] [id number] [phone]           Discharge Instructions    Call MD / Call 911   Complete by:  As directed    If you experience chest pain or shortness of breath, CALL 911 and be transported to the hospital emergency room.  If you develope a fever above 101 F, pus (white drainage) or increased drainage or redness at the wound, or calf pain, call your surgeon's office.   Change dressing   Complete by:  As directed    Maintain surgical dressing until follow up in the clinic. If the edges start to pull up, may reinforce with tape. If the dressing is no  longer working, may remove and cover with gauze and tape, but must keep the area dry and clean.  Call with any questions or concerns.   Constipation Prevention   Complete by:  As directed    Drink plenty of fluids.  Prune juice may be helpful.  You may use a stool softener, such as Colace (over the counter) 100 mg twice a day.  Use MiraLax (over the counter) for constipation as needed.   Diet - low sodium heart healthy   Complete by:  As directed    Discharge  instructions   Complete by:  As directed    Maintain surgical dressing until follow up in the clinic. If the edges start to pull up, may reinforce with tape. If the dressing is no longer working, may remove and cover with gauze and tape, but must keep the area dry and clean.  Follow up in 2 weeks at [facility]. Call with any questions or concerns.   Increase activity slowly as tolerated   Complete by:  As directed    Weight bearing as tolerated with assist device (walker, cane, etc) as directed, use it as long as suggested by your surgeon or therapist, typically at least 4-6 weeks.   TED hose   Complete by:  As directed    Use stockings (TED hose) for 2 weeks on both leg(s).  You may remove them at night for sleeping.      Allergies as of 03/20/2017      Reactions   Penicillins Rash, Other (See Comments)   Has patient had a PCN reaction causing immediate rash, facial/tongue/throat swelling, SOB or lightheadedness with hypotension: No Has patient had a PCN reaction causing severe rash involving mucus membranes or skin necrosis: No Has patient had a PCN reaction that required hospitalization No Has patient had a PCN reaction occurring within the last 10 years: No If all of the above answers are "NO", then may proceed with Cephalosporin use.      Medication List    STOP taking these medications   acetaminophen 650 MG CR tablet Commonly known as:  TYLENOL   BC FAST PAIN RELIEF ARTHRITIS 1000-65 MG Pack Generic drug:  Aspirin-Caffeine     TAKE these medications   apixaban 5 MG Tabs tablet Commonly known as:  ELIQUIS Take 1 tablet (5 mg total) by mouth 2 (two) times daily.   docusate sodium 100 MG capsule Commonly known as:  COLACE Take 1 capsule (100 mg total) by mouth 2 (two) times daily.   ferrous sulfate 325 (65 FE) MG tablet Commonly known as:  FERROUSUL Take 1 tablet (325 mg total) by mouth 3 (three) times daily with meals.   HYDROcodone-acetaminophen 7.5-325  MG tablet Commonly known as:  NORCO Take 1-2 tablets by mouth every 4 (four) hours as needed for moderate pain or severe pain.   lisinopril-hydrochlorothiazide 20-12.5 MG tablet Commonly known as:  PRINZIDE,ZESTORETIC Take 2 tablets by mouth daily.   methocarbamol 500 MG tablet Commonly known as:  ROBAXIN Take 1 tablet (500 mg total) by mouth every 6 (six) hours as needed for muscle spasms.   polyethylene glycol packet Commonly known as:  MIRALAX / GLYCOLAX Take 17 g by mouth 2 (two) times daily.            Discharge Care Instructions  (From admission, onward)        Start     Ordered   03/20/17 0000  Change dressing    Comments:  Maintain  surgical dressing until follow up in the clinic. If the edges start to pull up, may reinforce with tape. If the dressing is no longer working, may remove and cover with gauze and tape, but must keep the area dry and clean.  Call with any questions or concerns.   03/20/17 1610       Signed: Anastasio Auerbach. Boy Delamater   PA-C  03/25/2017, 4:00 PM

## 2017-04-22 ENCOUNTER — Encounter: Payer: Self-pay | Admitting: Cardiovascular Disease

## 2017-04-22 ENCOUNTER — Ambulatory Visit (INDEPENDENT_AMBULATORY_CARE_PROVIDER_SITE_OTHER): Payer: Medicare Other | Admitting: Cardiovascular Disease

## 2017-04-22 ENCOUNTER — Other Ambulatory Visit: Payer: Self-pay

## 2017-04-22 VITALS — BP 147/87 | HR 63 | Ht 70.0 in | Wt 215.0 lb

## 2017-04-22 DIAGNOSIS — I10 Essential (primary) hypertension: Secondary | ICD-10-CM

## 2017-04-22 DIAGNOSIS — I4892 Unspecified atrial flutter: Secondary | ICD-10-CM

## 2017-04-22 DIAGNOSIS — I119 Hypertensive heart disease without heart failure: Secondary | ICD-10-CM | POA: Diagnosis not present

## 2017-04-22 DIAGNOSIS — Z7901 Long term (current) use of anticoagulants: Secondary | ICD-10-CM | POA: Diagnosis not present

## 2017-04-22 MED ORDER — AMLODIPINE BESYLATE 5 MG PO TABS: 5.0000 mg | ORAL_TABLET | Freq: Every day | ORAL | 6 refills | Status: DC

## 2017-04-22 NOTE — Patient Instructions (Addendum)
Medication Instructions:   Begin Amlodipine 5mg  daily.   Continue all other medications.    Labwork: none  Testing/Procedures: none  Follow-Up: As needed   Any Other Special Instructions Will Be Listed Below (If Applicable).  If you need a refill on your cardiac medications before your next appointment, please call your pharmacy.

## 2017-04-22 NOTE — Progress Notes (Signed)
SUBJECTIVE: Patient presents for routine follow-up.  He was evaluated by EP on 03/12/17 and wanted to defer atrial flutter ablation until having both hips replaced.  He underwent left hip replacement surgery in January.  Echocardiogram 02/20/17 demonstrated normal left ventricular systolic function and regional wall motion, LVEF 60-65%, moderate LVH, and mild aortic, mitral, and tricuspid regurgitation.  Right ventricular systolic function was mildly reduced.  There is moderate left atrial dilatation.  He denies chest pain, palpitations, and shortness of breath.  He is feeling well after left hip replacement surgery.  He was supposed to have his right hip replaced February 15 but this has been delayed.  For this reason, he is considering undergoing atrial fibrillation sooner.    Review of Systems: As per "subjective", otherwise negative.  Allergies  Allergen Reactions  . Penicillins Rash and Other (See Comments)    Has patient had a PCN reaction causing immediate rash, facial/tongue/throat swelling, SOB or lightheadedness with hypotension: No Has patient had a PCN reaction causing severe rash involving mucus membranes or skin necrosis: No Has patient had a PCN reaction that required hospitalization No Has patient had a PCN reaction occurring within the last 10 years: No If all of the above answers are "NO", then may proceed with Cephalosporin use.    Current Outpatient Medications  Medication Sig Dispense Refill  . apixaban (ELIQUIS) 5 MG TABS tablet Take 1 tablet (5 mg total) by mouth 2 (two) times daily. 14 tablet 0  . lisinopril-hydrochlorothiazide (PRINZIDE,ZESTORETIC) 20-12.5 MG tablet Take 2 tablets by mouth daily.     . metoprolol tartrate (LOPRESSOR) 25 MG tablet Take 1 tablet (25 mg total) by mouth 2 (two) times daily. 60 tablet 6   No current facility-administered medications for this visit.     Past Medical History:  Diagnosis Date  . Dysrhythmia 02/2017   A flutter   . Hypertension     Past Surgical History:  Procedure Laterality Date  . TOTAL HIP ARTHROPLASTY Left 03/19/2017   Procedure: LEFT TOTAL HIP ARTHROPLASTY ANTERIOR APPROACH;  Surgeon: Durene Romans, MD;  Location: [facility];  Service: Orthopedics;  Laterality: Left;  70 mins    Social History   Socioeconomic History  . Marital status: Divorced    Spouse name: Not on file  . Number of children: Not on file  . Years of education: Not on file  . Highest education level: Not on file  Social Needs  . Financial resource strain: Not on file  . Food insecurity - worry: Not on file  . Food insecurity - inability: Not on file  . Transportation needs - medical: Not on file  . Transportation needs - non-medical: Not on file  Occupational History  . Not on file  Tobacco Use  . Smoking status: Never Smoker  . Smokeless tobacco: Current User    Types: Snuff  Substance and Sexual Activity  . Alcohol use: Yes    Comment: beer almost daily  . Drug use: Yes    Types: Cocaine    Comment: May 2018 last use   . Sexual activity: No  Other Topics Concern  . Not on file  Social History Narrative  . Not on file     Vitals:   04/22/17 1115  BP: (!) 147/87  Pulse: 63  SpO2: 100%  Weight: 215 lb (97.5 kg)  Height: 5\' 10"  (1.778 m)    Wt Readings from Last 3 Encounters:  04/22/17 215 lb (97.5 kg)  03/19/17  212 lb (96.2 kg)  03/15/17 212 lb (96.2 kg)     PHYSICAL EXAM General: NAD HEENT: Normal. Neck: No JVD, no thyromegaly. Lungs: Clear to auscultation bilaterally with normal respiratory effort. CV: Regular rate and rhythm, normal S1/S2, no S3/S4, no murmur. No pretibial or periankle edema.   Abdomen: Soft, nontender, no distention.  Neurologic: Alert and oriented.  Psych: Normal affect. Skin: Normal. Musculoskeletal: No gross deformities.    ECG: Most recent ECG reviewed.   Labs: Lab Results  Component Value Date/Time   K 4.1 03/20/2017 05:26 AM   BUN 26 (H) 03/20/2017  05:26 AM   CREATININE 1.29 (H) 03/20/2017 05:26 AM   HGB 9.7 (L) 03/20/2017 05:26 AM     Lipids: No results found for: LDLCALC, LDLDIRECT, CHOL, TRIG, HDL     ASSESSMENT AND PLAN:  1.  Atrial flutter: He is entirely asymptomatic. CHADSVASC score is 1, thus anticoagulation is not definitively indicated as thromboembolic risk is lower.    I have elected to anticoagulate with apixaban 5 mg twice daily.  Heart rate is controlled on metoprolol.  He was supposed to have his right hip replaced February 15 but this has been delayed.  For this reason, he is considering undergoing atrial fibrillation sooner.  2.  Chronic hypertension: Blood pressure remains elevated.  I will add amlodipine 5 mg daily.  3.  Hypertensive heart disease: He has moderate LVH.  Blood pressure is elevated today.  I am adding amlodipine 5 mg daily.   Disposition: Follow up with EP (Dr. Elberta Fortisamnitz) as previously scheduled.  Follow-up with me as needed.   Prentice DockerSuresh Charmagne Buhl, M.D., F.A.C.C.

## 2017-04-24 ENCOUNTER — Telehealth: Payer: Self-pay | Admitting: Cardiology

## 2017-04-24 NOTE — Telephone Encounter (Signed)
Pt want to move up his ablation.  Please call pt

## 2017-04-25 NOTE — Telephone Encounter (Signed)
Pt states that his hip replacement is being delayed until end of March/beginning in April.  He sees his orthopedic next week on 2/27. He would like to proceed w/ AFlutter ablation if possible. He is aware I will discuss w/ Dr. Elberta Fortisamnitz, next week, about need for anticoagulation post ablation.  Informed that he may still have to wait until after hip surgery d/t need for anticoagulation 42mo post ablation. Pt will call me Wednesday afternoon, after his appt w/ ortho, to discuss this further.

## 2017-06-03 ENCOUNTER — Telehealth: Payer: Self-pay | Admitting: Cardiology

## 2017-06-03 NOTE — Telephone Encounter (Signed)
Advised patient to keep appt next month.   Pt needs cardiac clearance for upcoming procedure.   Also informed patient we can discuss when to come back post surgery to evaluate AFLutter and if ablation still needed. Patient verbalized understanding and agreeable to plan.

## 2017-06-03 NOTE — Telephone Encounter (Signed)
New Message:     Pt states he needs to know whether to keep his appt due to some surgery he will be having.

## 2017-06-10 ENCOUNTER — Other Ambulatory Visit: Payer: Self-pay | Admitting: Cardiology

## 2017-06-10 ENCOUNTER — Ambulatory Visit (INDEPENDENT_AMBULATORY_CARE_PROVIDER_SITE_OTHER): Payer: Medicare Other | Admitting: Cardiology

## 2017-06-10 ENCOUNTER — Encounter: Payer: Self-pay | Admitting: Cardiology

## 2017-06-10 VITALS — BP 160/100 | HR 61 | Ht 70.0 in | Wt 221.0 lb

## 2017-06-10 DIAGNOSIS — I483 Typical atrial flutter: Secondary | ICD-10-CM | POA: Diagnosis not present

## 2017-06-10 DIAGNOSIS — I10 Essential (primary) hypertension: Secondary | ICD-10-CM | POA: Diagnosis not present

## 2017-06-10 MED ORDER — CARVEDILOL 12.5 MG PO TABS: 12.5000 mg | ORAL_TABLET | Freq: Two times a day (BID) | ORAL | 2 refills | Status: DC

## 2017-06-10 MED ORDER — AMLODIPINE BESYLATE 10 MG PO TABS: 10.0000 mg | ORAL_TABLET | Freq: Every day | ORAL | 2 refills | Status: DC

## 2017-06-10 NOTE — Progress Notes (Signed)
Electrophysiology Office Note   Date:  06/10/2017   ID:  Luis Nunez, DOB 08-04-1955, MRN [id number]  PCP:  Luis Boston., MD  Cardiologist:  Luis Nunez Primary Electrophysiologist:  Luis Lemming, MD    Chief Complaint  Patient presents with  . Follow-up    Typical AFlutter     History of Present Illness: Luis Nunez is a 62 y.o. male who is being seen today for the evaluation of atrial flutter at the request of Luis Nunez. Presenting today for electrophysiology evaluation.  He has a past history of hypertension.  He was hospitalized in December due to atrial flutter.  He was started on metoprolol and Coumadin.  He was diagnosed with hypertension at the age of 76 when he entered the [employer].  He has had coronary angiography in the past at age 20 and was told that this was normal.  Today, denies symptoms of palpitations, chest pain, shortness of breath, orthopnea, PND, lower extremity edema, claudication, dizziness, presyncope, syncope, bleeding, or neurologic sequela. The patient is tolerating medications without difficulties.  He is planning for hip replacement later in April.   Past Medical History:  Diagnosis Date  . Dysrhythmia 02/2017   A flutter  . Hypertension    Past Surgical History:  Procedure Laterality Date  . TOTAL HIP ARTHROPLASTY Left 03/19/2017   Procedure: LEFT TOTAL HIP ARTHROPLASTY ANTERIOR APPROACH;  Surgeon: Durene Romans, MD;  Location: [facility];  Service: Orthopedics;  Laterality: Left;  70 mins     Current Outpatient Medications  Medication Sig Dispense Refill  . apixaban (ELIQUIS) 5 MG TABS tablet Take 1 tablet (5 mg total) by mouth 2 (two) times daily. 14 tablet 0  . lisinopril-hydrochlorothiazide (PRINZIDE,ZESTORETIC) 20-12.5 MG tablet Take 2 tablets by mouth daily.      No current facility-administered medications for this visit.     Allergies:   Penicillins   Social History:  The patient  reports that he has never smoked.  His smokeless tobacco use includes snuff. He reports that he drinks alcohol. He reports that he has current or past drug history. Drug: Cocaine.   Family History:  The patient's family history includes Bone cancer in his brother; Breast cancer in his sister; Colon cancer in his brother; High blood pressure in his sister.   ROS:  Please see the history of present illness.   Otherwise, review of systems is positive for none.   All other systems are reviewed and negative.   PHYSICAL EXAM: VS:  BP (!) 160/100   Pulse 61   Ht 5\' 10"  (1.778 m)   Wt 221 lb (100.2 kg)   SpO2 98%   BMI 31.71 kg/m  , BMI Body mass index is 31.71 kg/m. GEN: Well nourished, well developed, in no acute distress  HEENT: normal  Neck: no JVD, carotid bruits, or masses Cardiac: RRR; no murmurs, rubs, or gallops,no edema  Respiratory:  clear to auscultation bilaterally, normal work of breathing GI: soft, nontender, nondistended, + BS MS: no deformity or atrophy  Skin: warm and dry Neuro:  Strength and sensation are intact Psych: euthymic mood, full affect  EKG:  EKG is not ordered today. Personal review of the ekg ordered 02/14/17 shows atrial flutter, rate 72   Recent Labs: 03/20/2017: BUN 26; Creatinine, Ser 1.29; Hemoglobin 9.7; Platelets 215; Potassium 4.1; Sodium 134    Lipid Panel  No results found for: CHOL, TRIG, HDL, CHOLHDL, VLDL, LDLCALC, LDLDIRECT   Wt Readings from Last 3 Encounters:  06/10/17 221 lb (100.2 kg)  04/22/17 215 lb (97.5 kg)  03/19/17 212 lb (96.2 kg)      Other studies Reviewed: Additional studies/ records that were reviewed today include: TTE 02/20/17  Review of the above records today demonstrates:  - Left ventricle: The cavity size was normal. Wall thickness was   increased in a pattern of moderate LVH. Systolic function was   normal. The estimated ejection fraction was in the range of 60%   to 65%. Wall motion was normal; there were no regional wall   motion  abnormalities. The study is not technically sufficient to   allow evaluation of LV diastolic function. - Aortic valve: Trileaflet; mildly thickened leaflets. There was   mild regurgitation. - Mitral valve: There was mild regurgitation. - Left atrium: The atrium was moderately dilated. - Right ventricle: Systolic function was mildly reduced. - Tricuspid valve: There was mild regurgitation.   ASSESSMENT AND PLAN:  1.  Atrial flutter: Currently on Eliquis.  Echo has been done without major abnormality.  He is rate controlled today.  We Reason Helzer plan for ablation.  Risks and benefits were discussed.  Risks include bleeding, tamponade, heart block, stroke, among others.  The patient understands these risks and is agreed to the procedure.  2.  Hypertension: Blood pressure is elevated today.  We Carnell Casamento increase his amlodipine to 10 mg, stop metoprolol, and start carvedilol 12.5 mg.    Current medicines are reviewed at length with the patient today.   The patient does not have concerns regarding his medicines.  The following changes were made today: Increase amlodipine, stop metoprolol, start carvedilol  Labs/ tests ordered today include:  No orders of the defined types were placed in this encounter.    Disposition:   FU with Herve Haug 1.5 months  Signed, Breylin Dom Jorja LoaMartin Shakyla Nolley, MD  06/10/2017 2:23 PM     [facility] [address] [place] [address] [phone] (office) [phone] (fax)

## 2017-06-10 NOTE — Patient Instructions (Addendum)
Medication Instructions:  Your physician has recommended you make the following change in your medication:  1. STOP Metoprolol 2. START Carvedilol 12.5 mg twice a day 3. INCREASE Amlodipine to 10 mg daily  Labwork: None ordered  Testing/Procedures: Your physician has recommended that you have an ablation. Catheter ablation is a medical procedure used to treat some cardiac arrhythmias (irregular heartbeats). During catheter ablation, a long, thin, flexible tube is put into a blood vessel in your groin (upper thigh), or neck. This tube is called an ablation catheter. It is then guided to your heart through the blood vessel. Radio frequency waves destroy small areas of heart tissue where abnormal heartbeats may cause an arrhythmia to start. Please see the instruction sheet given to you today.  Instructions for your ablation: 1. Please arrive at the [facility], Main Entrance "A", of [facility] at 6:30 a.m. on 07/31/2017. 2. Do not eat or drink after midnight the night prior to the procedure. 3. Do not miss any doses of Eliqui prior to the morning of the procedure.  4. Do not take any medications the morning of the procedure. 5. You will shaved for this procedure (if needed). Typically both groins, chest and back  are shaven. We ask that you shave these areas yourself at home 1-2 days prior  to the procedure. If you are uncomfortable/unable, then hospital staff will shave these areas the morning of your procedure. 6. Plan for an overnight stay in the hospital. 7. You will need someone to drive you home at discharge.   Follow-Up: Your physician recommends that you schedule a follow-up appointment between 07/17/2017 - 07/30/2017 with Dr. Elberta Fortis.  (this will be for history & physical and pre procedure lab work)  Your physician recommends that you schedule a follow-up appointment in: 4 weeks, after your procedure on 07/31/2017, with Dr. Elberta Fortis.  * If you need a refill on your cardiac  medications before your next appointment, please call your pharmacy.   *Please note that any paperwork needing to be filled out by the provider will need to be addressed at the front desk prior to seeing the provider. Please note that any FMLA, disability or other documents regarding health condition is subject to a $25.00 charge that must be received prior to completion of paperwork in the form of a money order or check.  Thank you for choosing [facility]!!   Dory Horn, RN [phone]  Any Other Special Instructions Will Be Listed Below (If Applicable).   Cardiac Ablation Cardiac ablation is a procedure to disable (ablate) a small amount of heart tissue in very specific places. The heart has many electrical connections. Sometimes these connections are abnormal and can cause the heart to beat very fast or irregularly. Ablating some of the problem areas can improve the heart rhythm or return it to normal. Ablation may be done for people who:  Have Wolff-Parkinson-White syndrome.  Have fast heart rhythms (tachycardia).  Have taken medicines for an abnormal heart rhythm (arrhythmia) that were not effective or caused side effects.  Have a high-risk heartbeat that may be life-threatening.  During the procedure, a small incision is made in the neck or the groin, and a long, thin, flexible tube (catheter) is inserted into the incision and moved to the heart. Small devices (electrodes) on the tip of the catheter will send out electrical currents. A type of X-ray (fluoroscopy) will be used to help guide the catheter and to provide images of the heart. Tell a  health care provider about:  Any allergies you have.  All medicines you are taking, including vitamins, herbs, eye drops, creams, and over-the-counter medicines.  Any problems you or family members have had with anesthetic medicines.  Any blood disorders you have.  Any surgeries you have had.  Any medical conditions you  have, such as kidney failure.  Whether you are pregnant or may be pregnant. What are the risks? Generally, this is a safe procedure. However, problems may occur, including:  Infection.  Bruising and bleeding at the catheter insertion site.  Bleeding into the chest, especially into the sac that surrounds the heart. This is a serious complication.  Stroke or blood clots.  Damage to other structures or organs.  Allergic reaction to medicines or dyes.  Need for a permanent pacemaker if the normal electrical system is damaged. A pacemaker is a small computer that sends electrical signals to the heart and helps your heart beat normally.  The procedure not being fully effective. This may not be recognized until months later. Repeat ablation procedures are sometimes required.  What happens before the procedure?  Follow instructions from your health care provider about eating or drinking restrictions.  Ask your health care provider about: ? Changing or stopping your regular medicines. This is especially important if you are taking diabetes medicines or blood thinners. ? Taking medicines such as aspirin and ibuprofen. These medicines can thin your blood. Do not take these medicines before your procedure if your health care provider instructs you not to.  Plan to have someone take you home from the hospital or clinic.  If you will be going home right after the procedure, plan to have someone with you for 24 hours. What happens during the procedure?  To lower your risk of infection: ? Your health care team will wash or sanitize their hands. ? Your skin will be washed with soap. ? Hair may be removed from the incision area.  An IV tube will be inserted into one of your veins.  You will be given a medicine to help you relax (sedative).  The skin on your neck or groin will be numbed.  An incision will be made in your neck or your groin.  A needle will be inserted through the incision  and into a large vein in your neck or groin.  A catheter will be inserted into the needle and moved to your heart.  Dye may be injected through the catheter to help your surgeon see the area of the heart that needs treatment.  Electrical currents will be sent from the catheter to ablate heart tissue in desired areas. There are three types of energy that may be used to ablate heart tissue: ? Heat (radiofrequency energy). ? Laser energy. ? Extreme cold (cryoablation).  When the necessary tissue has been ablated, the catheter will be removed.  Pressure will be held on the catheter insertion area to prevent excessive bleeding.  A bandage (dressing) will be placed over the catheter insertion area. The procedure may vary among health care providers and hospitals. What happens after the procedure?  Your blood pressure, heart rate, breathing rate, and blood oxygen level will be monitored until the medicines you were given have worn off.  Your catheter insertion area will be monitored for bleeding. You will need to lie still for a few hours to ensure that you do not bleed from the catheter insertion area.  Do not drive for 24 hours or as long as directed by  your health care provider. Summary  Cardiac ablation is a procedure to disable (ablate) a small amount of heart tissue in very specific places. Ablating some of the problem areas can improve the heart rhythm or return it to normal.  During the procedure, electrical currents will be sent from the catheter to ablate heart tissue in desired areas. This information is not intended to replace advice given to you by your health care provider. Make sure you discuss any questions you have with your health care provider. Document Released: 07/08/2008 Document Revised: 01/09/2016 Document Reviewed: 01/09/2016 Elsevier Interactive Patient Education  [employer].

## 2017-06-13 NOTE — H&P (Signed)
TOTAL HIP ADMISSION H&P  Patient is admitted for right total hip arthroplasty, anterior approach.  Subjective:  Chief Complaint:    Right hip primary OA / pain  HPI: Luis Nunez, 62 y.o. male, has a history of pain and functional disability in the right hip(s) due to arthritis and patient has failed non-surgical conservative treatments for greater than 12 weeks to include NSAID's and/or analgesics and activity modification.  Onset of symptoms was gradual starting 4-5 years ago with gradually worsening course since that time.The patient noted prior procedures of the hip to include arthroplasty on the left hip per Dr. Charlann Boxerlin in Jan 2019.  Patient currently rates pain in the right hip at 3 out of 10 with activity. Patient has worsening of pain with activity and weight bearing, trendelenberg gait, pain that interfers with activities of daily living and pain with passive range of motion. Patient has evidence of periarticular osteophytes and joint space narrowing by imaging studies. This condition presents safety issues increasing the risk of falls.  There is no current active infection.  Risks, benefits and expectations were discussed with the patient.  Risks including but not limited to the risk of anesthesia, blood clots, nerve damage, blood vessel damage, failure of the prosthesis, infection and up to and including death.  Patient understand the risks, benefits and expectations and wishes to proceed with surgery.   PCP: Louie Bostonapper, David B., MD  D/C Plans:       Home   Post-op Meds:       No Rx given   Tranexamic Acid:      To be given - IV   Decadron:      Is to be given  FYI:     Eliquis (on pre-op for a-fib)  Norco  DME:   Pt already has equipment  PT:   No PT    Patient Active Problem List   Diagnosis Date Noted  . Typical atrial flutter (HCC) 06/10/2017  . Essential hypertension 06/10/2017  . Obese 03/20/2017  . S/P left THA, AA 03/19/2017   Past Medical History:  Diagnosis Date   . Dysrhythmia 02/2017   A flutter  . Hypertension     Past Surgical History:  Procedure Laterality Date  . TOTAL HIP ARTHROPLASTY Left 03/19/2017   Procedure: LEFT TOTAL HIP ARTHROPLASTY ANTERIOR APPROACH;  Surgeon: Durene Romanslin, Soila Printup, MD;  Location: [facility];  Service: Orthopedics;  Laterality: Left;  70 mins    No current facility-administered medications for this encounter.    Current Outpatient Medications  Medication Sig Dispense Refill Last Dose  . amLODipine (NORVASC) 10 MG tablet Take 1 tablet (10 mg total) by mouth daily. 180 tablet 2   . apixaban (ELIQUIS) 5 MG TABS tablet Take 1 tablet (5 mg total) by mouth 2 (two) times daily. 14 tablet 0 Taking  . carvedilol (COREG) 12.5 MG tablet Take 1 tablet (12.5 mg total) by mouth 2 (two) times daily. 180 tablet 2   . lisinopril-hydrochlorothiazide (PRINZIDE,ZESTORETIC) 20-12.5 MG tablet Take 2 tablets by mouth daily.    Taking   Allergies  Allergen Reactions  . Penicillins Rash and Other (See Comments)    Has patient had a PCN reaction causing immediate rash, facial/tongue/throat swelling, SOB or lightheadedness with hypotension: No Has patient had a PCN reaction causing severe rash involving mucus membranes or skin necrosis: No Has patient had a PCN reaction that required hospitalization No Has patient had a PCN reaction occurring within the last 10 years: No If all of  the above answers are "NO", then may proceed with Cephalosporin use.    Social History   Tobacco Use  . Smoking status: Never Smoker  . Smokeless tobacco: Current User    Types: Snuff  Substance Use Topics  . Alcohol use: Yes    Comment: beer almost daily    Family History  Problem Relation Age of Onset  . High blood pressure Sister   . Breast cancer Sister   . Colon cancer Brother   . Bone cancer Brother      Review of Systems  Constitutional: Negative.   HENT: Negative.   Eyes: Negative.   Respiratory: Negative.   Cardiovascular: Negative.    Gastrointestinal: Negative.   Genitourinary: Negative.   Musculoskeletal: Positive for joint pain.  Skin: Negative.   Neurological: Negative.   Endo/Heme/Allergies: Negative.   Psychiatric/Behavioral: Negative.     Objective:  Physical Exam  Constitutional: He is oriented to person, place, and time. He appears well-developed.  HENT:  Head: Normocephalic.  Eyes: Pupils are equal, round, and reactive to light.  Neck: Neck supple. No JVD present. No tracheal deviation present. No thyromegaly present.  Cardiovascular: Normal rate, regular rhythm and intact distal pulses.  Respiratory: Effort normal and breath sounds normal. No respiratory distress. He has no wheezes.  GI: Soft. There is no tenderness. There is no guarding.  Musculoskeletal:       Right hip: He exhibits decreased range of motion, decreased strength, tenderness and bony tenderness. He exhibits no swelling, no deformity and no laceration.  Lymphadenopathy:    He has no cervical adenopathy.  Neurological: He is alert and oriented to person, place, and time.  Skin: Skin is warm and dry.  Psychiatric: He has a normal mood and affect.      Labs:  Estimated body mass index is 31.71 kg/m as calculated from the following:   Height as of 06/10/17: 5\' 10"  (1.778 m).   Weight as of 06/10/17: 100.2 kg (221 lb).   Imaging Review Plain radiographs demonstrate severe degenerative joint disease of the right hip(s). The bone quality appears to be good for age and reported activity level.    Preoperative templating of the joint replacement has been completed, documented, and submitted to the Operating Room personnel in order to optimize intra-operative equipment management.       Assessment/Plan:  End stage arthritis, right hip  The patient history, physical examination, clinical judgement of the provider and imaging studies are consistent with end stage degenerative joint disease of the right hip and total hip  arthroplasty is deemed medically necessary. The treatment options including medical management, injection therapy, arthroscopy and arthroplasty were discussed at length. The risks and benefits of total hip arthroplasty were presented and reviewed. The risks due to aseptic loosening, infection, stiffness, dislocation/subluxation,  thromboembolic complications and other imponderables were discussed.  The patient acknowledged the explanation, agreed to proceed with the plan and consent was signed. Patient is being admitted for inpatient treatment for surgery, pain control, PT, OT, prophylactic antibiotics, VTE prophylaxis, progressive ambulation and ADL's and discharge planning.The patient is planning to be discharged home.    Anastasio Auerbach Zayleigh Stroh   PA-C  06/13/2017, 9:39 AM

## 2017-06-17 ENCOUNTER — Other Ambulatory Visit (HOSPITAL_COMMUNITY): Payer: Self-pay | Admitting: *Deleted

## 2017-06-17 NOTE — Progress Notes (Signed)
LOV DR CARMNITZ CARDIOLOGY 06-10-17 Epic ECHO 02-20-17 Epic EKG 02-14-17 EPIC

## 2017-06-17 NOTE — Patient Instructions (Addendum)
Luis Nunez  06/17/2017   Your procedure is scheduled on: 06-25-17  Report to [facility] Main  Entrance  Report to admitting at  1205 PM   Call this number if you have problems the morning of surgery [phone]   Remember: Do not eat food :After Midnight.CLEAR LIQUIDS FROM MIDNIGHT UNTIL 800 AM DAY OF SURGERY, NOTHING BY MOUTH AFTER 800 AM DAY OF SURGERY.    CALL DR CAMNITZ WHEN TO STOP  ELIQUIS PRIOR TO SURGERY WITH DR Charlann Boxer  CLEAR LIQUID DIET   Foods Allowed                                                                     Foods Excluded  Coffee and tea, regular and decaf                             liquids that you cannot  Plain Jell-O in any flavor                                             see through such as: Fruit ices (not with fruit pulp)                                     milk, soups, orange juice  Iced Popsicles                                    All solid food Carbonated beverages, regular and diet                                    Cranberry, grape and apple juices Sports drinks like Gatorade Lightly seasoned clear broth or consume(fat free) Sugar, honey syrup  Sample Menu Breakfast                                Lunch                                     Supper Cranberry juice                    Beef broth                            Chicken broth Jell-O                                     Grape juice  Apple juice Coffee or tea                        Jell-O                                      Popsicle                                                Coffee or tea                        Coffee or tea MEDICATIONS TO TAKE AM OF SUGERY:CARVEDILOL (COREG) METOPROLOL _____________________________________________________________________ [facility] Before surgery, you can play an important role.  Because skin is not sterile, your skin needs to be as free of germs as possible.  You can reduce the  number of germs on your skin by washing with CHG (chlorahexidine gluconate) soap before surgery.  CHG is an antiseptic cleaner which kills germs and bonds with the skin to continue killing germs even after washing. Please DO NOT use if you have an allergy to CHG or antibacterial soaps.  If your skin becomes reddened/irritated stop using the CHG and inform your nurse when you arrive at Short Stay. Do not shave (including legs and underarms) for at least 48 hours prior to the first CHG shower.  You may shave your face/neck. Please follow these instructions carefully:  1.  Shower with CHG Soap the night before surgery and the  morning of Surgery.  2.  If you choose to wash your hair, wash your hair first as usual with your  normal  shampoo.  3.  After you shampoo, rinse your hair and body thoroughly to remove the  shampoo.                           4.  Use CHG as you would any other liquid soap.  You can apply chg directly  to the skin and wash                       Gently with a scrungie or clean washcloth.  5.  Apply the CHG Soap to your body ONLY FROM THE NECK DOWN.   Do not use on face/ open                           Wound or open sores. Avoid contact with eyes, ears mouth and genitals (private parts).                       Wash face,  Genitals (private parts) with your normal soap.             6.  Wash thoroughly, paying special attention to the area where your surgery  will be performed.  7.  Thoroughly rinse your body with warm water from the neck down.  8.  DO NOT shower/wash with your normal soap after using and rinsing off  the CHG Soap.                9.  Pat yourself dry with  a clean towel.            10.  Wear clean pajamas.            11.  Place clean sheets on your bed the night of your first shower and do not  sleep with pets. Day of Surgery : Do not apply any lotions/deodorants the morning of surgery.  Please wear clean clothes to the hospital/surgery center.  FAILURE TO FOLLOW  THESE INSTRUCTIONS MAY RESULT IN THE CANCELLATION OF YOUR SURGERY PATIENT SIGNATURE_________________________________  NURSE SIGNATURE__________________________________  ________________________________________________________________________   Adam Phenix  An incentive spirometer is a tool that can help keep your lungs clear and active. This tool measures how well you are filling your lungs with each breath. Taking long deep breaths may help reverse or decrease the chance of developing breathing (pulmonary) problems (especially infection) following:  A long period of time when you are unable to move or be active. BEFORE THE PROCEDURE   If the spirometer includes an indicator to show your best effort, your nurse or respiratory therapist will set it to a desired goal.  If possible, sit up straight or lean slightly forward. Try not to slouch.  Hold the incentive spirometer in an upright position. INSTRUCTIONS FOR USE  1. Sit on the edge of your bed if possible, or sit up as far as you can in bed or on a chair. 2. Hold the incentive spirometer in an upright position. 3. Breathe out normally. 4. Place the mouthpiece in your mouth and seal your lips tightly around it. 5. Breathe in slowly and as deeply as possible, raising the piston or the ball toward the top of the column. 6. Hold your breath for 3-5 seconds or for as long as possible. Allow the piston or ball to fall to the bottom of the column. 7. Remove the mouthpiece from your mouth and breathe out normally. 8. Rest for a few seconds and repeat Steps 1 through 7 at least 10 times every 1-2 hours when you are awake. Take your time and take a few normal breaths between deep breaths. 9. The spirometer may include an indicator to show your best effort. Use the indicator as a goal to work toward during each repetition. 10. After each set of 10 deep breaths, practice coughing to be sure your lungs are clear. If you have an incision  (the cut made at the time of surgery), support your incision when coughing by placing a pillow or rolled up towels firmly against it. Once you are able to get out of bed, walk around indoors and cough well. You may stop using the incentive spirometer when instructed by your caregiver.  RISKS AND COMPLICATIONS  Take your time so you do not get dizzy or light-headed.  If you are in pain, you may need to take or ask for pain medication before doing incentive spirometry. It is harder to take a deep breath if you are having pain. AFTER USE  Rest and breathe slowly and easily.  It can be helpful to keep track of a log of your progress. Your caregiver can provide you with a simple table to help with this. If you are using the spirometer at home, follow these instructions: [place] IF:   You are having difficultly using the spirometer.  You have trouble using the spirometer as often as instructed.  Your pain medication is not giving enough relief while using the spirometer.  You develop fever of 100.5 F (38.1  C) or higher. SEEK IMMEDIATE MEDICAL CARE IF:   You cough up bloody sputum that had not been present before.  You develop fever of 102 F (38.9 C) or greater.  You develop worsening pain at or near the incision site. MAKE SURE YOU:   Understand these instructions.  Will watch your condition.  Will get help right away if you are not doing well or get worse. Document Released: 07/02/2006 Document Revised: 05/14/2011 Document Reviewed: 09/02/2006 ExitCare Patient Information 2014 ExitCare, [place].   ________________________________________________________________________  WHAT IS A BLOOD TRANSFUSION? Blood Transfusion Information  A transfusion is the replacement of blood or some of its parts. Blood is made up of multiple cells which provide different functions.  Red blood cells carry oxygen and are used for blood loss replacement.  White blood cells fight against  infection.  Platelets control bleeding.  Plasma helps clot blood.  Other blood products are available for specialized needs, such as hemophilia or other clotting disorders. BEFORE THE TRANSFUSION  Who gives blood for transfusions?   Healthy volunteers who are fully evaluated to make sure their blood is safe. This is blood bank blood. Transfusion therapy is the safest it has ever been in the practice of medicine. Before blood is taken from a donor, a complete history is taken to make sure that person has no history of diseases nor engages in risky social behavior (examples are intravenous drug use or sexual activity with multiple partners). The donor's travel history is screened to minimize risk of transmitting infections, such as malaria. The donated blood is tested for signs of infectious diseases, such as HIV and hepatitis. The blood is then tested to be sure it is compatible with you in order to minimize the chance of a transfusion reaction. If you or a relative donates blood, this is often done in anticipation of surgery and is not appropriate for emergency situations. It takes many days to process the donated blood. RISKS AND COMPLICATIONS Although transfusion therapy is very safe and saves many lives, the main dangers of transfusion include:   Getting an infectious disease.  Developing a transfusion reaction. This is an allergic reaction to something in the blood you were given. Every precaution is taken to prevent this. The decision to have a blood transfusion has been considered carefully by your caregiver before blood is given. Blood is not given unless the benefits outweigh the risks. AFTER THE TRANSFUSION  Right after receiving a blood transfusion, you will usually feel much better and more energetic. This is especially true if your red blood cells have gotten low (anemic). The transfusion raises the level of the red blood cells which carry oxygen, and this usually causes an energy  increase.  The nurse administering the transfusion will monitor you carefully for complications. HOME CARE INSTRUCTIONS  No special instructions are needed after a transfusion. You may find your energy is better. Speak with your caregiver about any limitations on activity for underlying diseases you may have. SEEK MEDICAL CARE IF:   Your condition is not improving after your transfusion.  You develop redness or irritation at the intravenous (IV) site. SEEK IMMEDIATE MEDICAL CARE IF:  Any of the following symptoms occur over the next 12 hours:  Shaking chills.  You have a temperature by mouth above 102 F (38.9 C), not controlled by medicine.  Chest, back, or muscle pain.  People around you feel you are not acting correctly or are confused.  Shortness of breath or difficulty breathing.  Dizziness  and fainting.  You get a rash or develop hives.  You have a decrease in urine output.  Your urine turns a dark color or changes to pink, red, or brown. Any of the following symptoms occur over the next 10 days:  You have a temperature by mouth above 102 F (38.9 C), not controlled by medicine.  Shortness of breath.  Weakness after normal activity.  The white part of the eye turns yellow (jaundice).  You have a decrease in the amount of urine or are urinating less often.  Your urine turns a dark color or changes to pink, red, or brown. Document Released: 02/17/2000 Document Revised: 05/14/2011 Document Reviewed: 10/06/2007 ExitCare Patient Information 2014 ExitCare, [place].  _______________________________________________________________________                                  Bonita Quin may not have any metal on your body including hair pins and              piercings  Do not wear jewelry, make-up, lotions, powders or perfumes, deodorant             Do not wear nail polish.  Do not shave  48 hours prior to surgery.              Men may shave face and neck.   Do not bring  valuables to the hospital. [facility] IS NOT             RESPONSIBLE   FOR VALUABLES.  Contacts, dentures or bridgework may not be worn into surgery.  Leave suitcase in the car. After surgery it may be brought to your room.                   Please read over the following fact sheets you were given: _____________________________________________________________________

## 2017-06-19 ENCOUNTER — Encounter (HOSPITAL_COMMUNITY): Payer: Self-pay

## 2017-06-19 ENCOUNTER — Encounter (HOSPITAL_COMMUNITY)
Admission: RE | Admit: 2017-06-19 | Discharge: 2017-06-19 | Disposition: A | Discharge status: Home / Self Care | Payer: Medicare Other | Source: Ambulatory Visit | Attending: Orthopedic Surgery | Admitting: Orthopedic Surgery

## 2017-06-19 ENCOUNTER — Other Ambulatory Visit: Payer: Self-pay

## 2017-06-19 DIAGNOSIS — Z01812 Encounter for preprocedural laboratory examination: Secondary | ICD-10-CM | POA: Insufficient documentation

## 2017-06-19 DIAGNOSIS — M1611 Unilateral primary osteoarthritis, right hip: Secondary | ICD-10-CM | POA: Diagnosis not present

## 2017-06-19 DIAGNOSIS — Z0183 Encounter for blood typing: Secondary | ICD-10-CM | POA: Insufficient documentation

## 2017-06-19 LAB — BASIC METABOLIC PANEL WITH GFR
Anion gap: 10 (ref 5–15)
BUN: 24 mg/dL — ABNORMAL HIGH (ref 6–20)
CO2: 25 mmol/L (ref 22–32)
Calcium: 9.2 mg/dL (ref 8.9–10.3)
Chloride: 101 mmol/L (ref 101–111)
Creatinine, Ser: 1.8 mg/dL — ABNORMAL HIGH (ref 0.61–1.24)
GFR calc Af Amer: 45 mL/min — ABNORMAL LOW
GFR calc non Af Amer: 39 mL/min — ABNORMAL LOW
Glucose, Bld: 86 mg/dL (ref 65–99)
Potassium: 3.8 mmol/L (ref 3.5–5.1)
Sodium: 136 mmol/L (ref 135–145)

## 2017-06-19 LAB — CBC
HEMATOCRIT: 39.9 % (ref 39.0–52.0)
HEMOGLOBIN: 13.4 g/dL (ref 13.0–17.0)
MCH: 30 pg (ref 26.0–34.0)
MCHC: 33.6 g/dL (ref 30.0–36.0)
MCV: 89.3 fL (ref 78.0–100.0)
Platelets: 294 10*3/uL (ref 150–400)
RBC: 4.47 MIL/uL (ref 4.22–5.81)
RDW: 13.8 % (ref 11.5–15.5)
WBC: 6 10*3/uL (ref 4.0–10.5)

## 2017-06-19 LAB — SURGICAL PCR SCREEN
MRSA, PCR: NEGATIVE
Staphylococcus aureus: POSITIVE — AB

## 2017-06-19 NOTE — Progress Notes (Signed)
CALLED [facility] FOR CARDIOLOGY CLEARANCE NOTE FROM DR Elberta FortisAMNITZ

## 2017-06-19 NOTE — Progress Notes (Signed)
bmet results faxed via epic to dr Charlann Boxerolin

## 2017-06-21 ENCOUNTER — Telehealth: Payer: Self-pay | Admitting: Cardiology

## 2017-06-21 NOTE — Telephone Encounter (Signed)
Pt calling in stating that yesterday hospital pre op staff instructed him to call our office to move his ablation date out further. He also mentions that they need to know when he needs to stop Eliquis prior to hip procedure.  Advised patient that their office would need to send a clearance request to stop this medication".  But explained that he will need to hold it for 3 days prior to procedure.  Informed patient that I am unaware as to why they feel we need to move ablation date out.  He understands I will discuss this w/ their office before we make any changes to scheduled date.  Patient verbalized understanding and understands I will follow up w/ him in the next week/two..Marland Kitchen

## 2017-06-21 NOTE — Telephone Encounter (Signed)
New Message  Pt calling to speak with a nurse

## 2017-06-24 MED ORDER — TRANEXAMIC ACID 1000 MG/10ML IV SOLN
1000.0000 mg | INTRAVENOUS | Status: AC
  Administered 2017-06-25: 1000 mg via INTRAVENOUS
  Filled 2017-06-24: qty 1100

## 2017-06-25 ENCOUNTER — Inpatient Hospital Stay (HOSPITAL_COMMUNITY)
Admission: RE | Admit: 2017-06-25 | Discharge: 2017-06-26 | DRG: 470 | Disposition: A | Discharge status: Home / Self Care | Payer: Medicare Other | Attending: Orthopedic Surgery | Admitting: Orthopedic Surgery

## 2017-06-25 ENCOUNTER — Encounter (HOSPITAL_COMMUNITY)
Admission: RE | Disposition: A | Discharge status: Home / Self Care | Payer: Self-pay | Source: Ambulatory Visit | Attending: Orthopedic Surgery

## 2017-06-25 ENCOUNTER — Inpatient Hospital Stay (HOSPITAL_COMMUNITY): Payer: Medicare Other | Admitting: Registered Nurse

## 2017-06-25 ENCOUNTER — Inpatient Hospital Stay (HOSPITAL_COMMUNITY): Payer: Medicare Other

## 2017-06-25 ENCOUNTER — Encounter (HOSPITAL_COMMUNITY): Payer: Self-pay

## 2017-06-25 ENCOUNTER — Other Ambulatory Visit: Payer: Self-pay

## 2017-06-25 DIAGNOSIS — Z79899 Other long term (current) drug therapy: Secondary | ICD-10-CM

## 2017-06-25 DIAGNOSIS — I10 Essential (primary) hypertension: Secondary | ICD-10-CM | POA: Diagnosis present

## 2017-06-25 DIAGNOSIS — I4892 Unspecified atrial flutter: Secondary | ICD-10-CM | POA: Diagnosis present

## 2017-06-25 DIAGNOSIS — E669 Obesity, unspecified: Secondary | ICD-10-CM | POA: Diagnosis present

## 2017-06-25 DIAGNOSIS — M1611 Unilateral primary osteoarthritis, right hip: Secondary | ICD-10-CM | POA: Diagnosis present

## 2017-06-25 DIAGNOSIS — Z803 Family history of malignant neoplasm of breast: Secondary | ICD-10-CM | POA: Diagnosis not present

## 2017-06-25 DIAGNOSIS — F17290 Nicotine dependence, other tobacco product, uncomplicated: Secondary | ICD-10-CM | POA: Diagnosis present

## 2017-06-25 DIAGNOSIS — Z8249 Family history of ischemic heart disease and other diseases of the circulatory system: Secondary | ICD-10-CM | POA: Diagnosis not present

## 2017-06-25 DIAGNOSIS — Z9181 History of falling: Secondary | ICD-10-CM | POA: Diagnosis not present

## 2017-06-25 DIAGNOSIS — Z96649 Presence of unspecified artificial hip joint: Secondary | ICD-10-CM

## 2017-06-25 DIAGNOSIS — Z96642 Presence of left artificial hip joint: Secondary | ICD-10-CM | POA: Diagnosis present

## 2017-06-25 DIAGNOSIS — I4891 Unspecified atrial fibrillation: Secondary | ICD-10-CM | POA: Diagnosis present

## 2017-06-25 DIAGNOSIS — Z800 Family history of malignant neoplasm of digestive organs: Secondary | ICD-10-CM

## 2017-06-25 DIAGNOSIS — Z7901 Long term (current) use of anticoagulants: Secondary | ICD-10-CM

## 2017-06-25 DIAGNOSIS — Z808 Family history of malignant neoplasm of other organs or systems: Secondary | ICD-10-CM | POA: Diagnosis not present

## 2017-06-25 DIAGNOSIS — Z6831 Body mass index (BMI) 31.0-31.9, adult: Secondary | ICD-10-CM

## 2017-06-25 DIAGNOSIS — M25751 Osteophyte, right hip: Secondary | ICD-10-CM | POA: Diagnosis present

## 2017-06-25 DIAGNOSIS — Z880 Allergy status to penicillin: Secondary | ICD-10-CM

## 2017-06-25 DIAGNOSIS — Z96641 Presence of right artificial hip joint: Secondary | ICD-10-CM

## 2017-06-25 HISTORY — PX: TOTAL HIP ARTHROPLASTY: SHX124

## 2017-06-25 LAB — TYPE AND SCREEN
ABO/RH(D): B POS
ANTIBODY SCREEN: NEGATIVE

## 2017-06-25 SURGERY — ARTHROPLASTY, HIP, TOTAL, ANTERIOR APPROACH
Anesthesia: Spinal | Site: Hip | Laterality: Right

## 2017-06-25 MED ORDER — POLYETHYLENE GLYCOL 3350 17 G PO PACK
17.0000 g | PACK | Freq: Two times a day (BID) | ORAL | Status: DC
  Administered 2017-06-26: 17 g via ORAL
  Filled 2017-06-25: qty 1

## 2017-06-25 MED ORDER — LACTATED RINGERS IV SOLN
INTRAVENOUS | Status: DC
Start: 2017-06-25 — End: 2017-06-25
  Administered 2017-06-25: 12:00:00 via INTRAVENOUS

## 2017-06-25 MED ORDER — FERROUS SULFATE 325 (65 FE) MG PO TABS
325.0000 mg | ORAL_TABLET | Freq: Three times a day (TID) | ORAL | Status: DC
  Administered 2017-06-26: 325 mg via ORAL
  Filled 2017-06-25: qty 1

## 2017-06-25 MED ORDER — ACETAMINOPHEN 325 MG PO TABS: 325.0000 mg | ORAL_TABLET | Freq: Four times a day (QID) | ORAL | Status: DC | PRN

## 2017-06-25 MED ORDER — METHOCARBAMOL 500 MG PO TABS: 500.0000 mg | ORAL_TABLET | Freq: Four times a day (QID) | ORAL | 0 refills | Status: DC | PRN

## 2017-06-25 MED ORDER — METOCLOPRAMIDE HCL 5 MG/ML IJ SOLN: 5.0000 mg | Freq: Three times a day (TID) | INTRAMUSCULAR | Status: DC | PRN

## 2017-06-25 MED ORDER — ONDANSETRON HCL 4 MG/2ML IJ SOLN
INTRAMUSCULAR | Status: DC | PRN
  Administered 2017-06-25: 4 mg via INTRAVENOUS

## 2017-06-25 MED ORDER — CEFAZOLIN SODIUM-DEXTROSE 2-4 GM/100ML-% IV SOLN
2.0000 g | INTRAVENOUS | Status: AC
  Administered 2017-06-25: 2 g via INTRAVENOUS

## 2017-06-25 MED ORDER — APIXABAN 2.5 MG PO TABS
2.5000 mg | ORAL_TABLET | Freq: Two times a day (BID) | ORAL | Status: DC
  Administered 2017-06-26: 2.5 mg via ORAL
  Filled 2017-06-25: qty 1

## 2017-06-25 MED ORDER — FENTANYL CITRATE (PF) 100 MCG/2ML IJ SOLN
INTRAMUSCULAR | Status: DC | PRN
  Administered 2017-06-25: 50 ug via INTRAVENOUS

## 2017-06-25 MED ORDER — METOCLOPRAMIDE HCL 5 MG PO TABS: 5.0000 mg | ORAL_TABLET | Freq: Three times a day (TID) | ORAL | Status: DC | PRN

## 2017-06-25 MED ORDER — CEFAZOLIN SODIUM-DEXTROSE 2-4 GM/100ML-% IV SOLN
INTRAVENOUS | Status: AC
  Filled 2017-06-25: qty 100

## 2017-06-25 MED ORDER — BISACODYL 10 MG RE SUPP: 10.0000 mg | Freq: Every day | RECTAL | Status: DC | PRN

## 2017-06-25 MED ORDER — ONDANSETRON HCL 4 MG/2ML IJ SOLN: 4.0000 mg | Freq: Four times a day (QID) | INTRAMUSCULAR | Status: DC | PRN

## 2017-06-25 MED ORDER — METOPROLOL TARTRATE 25 MG PO TABS
25.0000 mg | ORAL_TABLET | Freq: Every day | ORAL | Status: DC
  Administered 2017-06-25: 25 mg via ORAL

## 2017-06-25 MED ORDER — MORPHINE SULFATE (PF) 2 MG/ML IV SOLN
0.5000 mg | INTRAVENOUS | Status: DC | PRN
  Administered 2017-06-25: 1 mg via INTRAVENOUS
  Filled 2017-06-25: qty 1

## 2017-06-25 MED ORDER — PHENYLEPHRINE HCL 10 MG/ML IJ SOLN
INTRAVENOUS | Status: DC | PRN
  Administered 2017-06-25: 20 ug/min via INTRAVENOUS

## 2017-06-25 MED ORDER — PROPOFOL 10 MG/ML IV BOLUS
INTRAVENOUS | Status: AC
  Filled 2017-06-25: qty 20

## 2017-06-25 MED ORDER — MIDAZOLAM HCL 2 MG/2ML IJ SOLN
INTRAMUSCULAR | Status: AC
  Filled 2017-06-25: qty 2

## 2017-06-25 MED ORDER — HYDROCODONE-ACETAMINOPHEN 7.5-325 MG PO TABS: 1.0000 | ORAL_TABLET | ORAL | 0 refills | Status: DC | PRN

## 2017-06-25 MED ORDER — CELECOXIB 200 MG PO CAPS
200.0000 mg | ORAL_CAPSULE | Freq: Two times a day (BID) | ORAL | Status: DC
  Administered 2017-06-25 – 2017-06-26 (×2): 200 mg via ORAL
  Filled 2017-06-25 (×2): qty 1

## 2017-06-25 MED ORDER — METHOCARBAMOL 500 MG PO TABS
500.0000 mg | ORAL_TABLET | Freq: Four times a day (QID) | ORAL | Status: DC | PRN
  Administered 2017-06-25 – 2017-06-26 (×2): 500 mg via ORAL
  Filled 2017-06-25 (×2): qty 1

## 2017-06-25 MED ORDER — ONDANSETRON HCL 4 MG PO TABS: 4.0000 mg | ORAL_TABLET | Freq: Four times a day (QID) | ORAL | Status: DC | PRN

## 2017-06-25 MED ORDER — PHENOL 1.4 % MT LIQD: 1.0000 | OROMUCOSAL | Status: DC | PRN

## 2017-06-25 MED ORDER — ONDANSETRON HCL 4 MG/2ML IJ SOLN: 4.0000 mg | Freq: Once | INTRAMUSCULAR | Status: DC | PRN

## 2017-06-25 MED ORDER — MENTHOL 3 MG MT LOZG: 1.0000 | LOZENGE | OROMUCOSAL | Status: DC | PRN

## 2017-06-25 MED ORDER — SODIUM CHLORIDE 0.9 % IV SOLN
INTRAVENOUS | Status: DC
  Administered 2017-06-25: 18:00:00 via INTRAVENOUS

## 2017-06-25 MED ORDER — DEXAMETHASONE SODIUM PHOSPHATE 10 MG/ML IJ SOLN
10.0000 mg | Freq: Once | INTRAMUSCULAR | Status: AC
  Administered 2017-06-25: 10 mg via INTRAVENOUS

## 2017-06-25 MED ORDER — BUPIVACAINE IN DEXTROSE 0.75-8.25 % IT SOLN
INTRATHECAL | Status: DC | PRN
  Administered 2017-06-25: 2 mL via INTRATHECAL

## 2017-06-25 MED ORDER — SODIUM CHLORIDE 0.9 % IR SOLN
Status: DC | PRN
  Administered 2017-06-25: 1000 mL

## 2017-06-25 MED ORDER — FENTANYL CITRATE (PF) 100 MCG/2ML IJ SOLN
INTRAMUSCULAR | Status: AC
  Filled 2017-06-25: qty 2

## 2017-06-25 MED ORDER — METHOCARBAMOL 1000 MG/10ML IJ SOLN
500.0000 mg | Freq: Four times a day (QID) | INTRAVENOUS | Status: DC | PRN
  Administered 2017-06-25: 500 mg via INTRAVENOUS
  Filled 2017-06-25: qty 550

## 2017-06-25 MED ORDER — PHENYLEPHRINE HCL 10 MG/ML IJ SOLN
INTRAMUSCULAR | Status: AC
  Filled 2017-06-25: qty 2

## 2017-06-25 MED ORDER — STERILE WATER FOR IRRIGATION IR SOLN
Status: DC | PRN
  Administered 2017-06-25: 2000 mL

## 2017-06-25 MED ORDER — PROPOFOL 10 MG/ML IV BOLUS
INTRAVENOUS | Status: AC
  Filled 2017-06-25: qty 40

## 2017-06-25 MED ORDER — FENTANYL CITRATE (PF) 100 MCG/2ML IJ SOLN: 25.0000 ug | INTRAMUSCULAR | Status: DC | PRN

## 2017-06-25 MED ORDER — HYDROCODONE-ACETAMINOPHEN 5-325 MG PO TABS
1.0000 | ORAL_TABLET | ORAL | Status: DC | PRN
  Administered 2017-06-25 – 2017-06-26 (×2): 2 via ORAL
  Filled 2017-06-25 (×2): qty 2

## 2017-06-25 MED ORDER — FERROUS SULFATE 325 (65 FE) MG PO TABS: 325.0000 mg | ORAL_TABLET | Freq: Three times a day (TID) | ORAL | 3 refills | Status: DC

## 2017-06-25 MED ORDER — CEFAZOLIN SODIUM-DEXTROSE 2-4 GM/100ML-% IV SOLN
2.0000 g | Freq: Four times a day (QID) | INTRAVENOUS | Status: AC
  Administered 2017-06-25 – 2017-06-26 (×2): 2 g via INTRAVENOUS
  Filled 2017-06-25 (×2): qty 100

## 2017-06-25 MED ORDER — DOCUSATE SODIUM 100 MG PO CAPS
100.0000 mg | ORAL_CAPSULE | Freq: Two times a day (BID) | ORAL | Status: DC
  Administered 2017-06-25 – 2017-06-26 (×2): 100 mg via ORAL
  Filled 2017-06-25 (×2): qty 1

## 2017-06-25 MED ORDER — MAGNESIUM CITRATE PO SOLN: 1.0000 | Freq: Once | ORAL | Status: DC | PRN

## 2017-06-25 MED ORDER — PROPOFOL 500 MG/50ML IV EMUL
INTRAVENOUS | Status: DC | PRN
  Administered 2017-06-25: 40 ug/kg/min via INTRAVENOUS

## 2017-06-25 MED ORDER — DEXAMETHASONE SODIUM PHOSPHATE 10 MG/ML IJ SOLN: 10.0000 mg | Freq: Once | INTRAMUSCULAR | Status: DC

## 2017-06-25 MED ORDER — MIDAZOLAM HCL 5 MG/5ML IJ SOLN
INTRAMUSCULAR | Status: DC | PRN
  Administered 2017-06-25: 2 mg via INTRAVENOUS

## 2017-06-25 MED ORDER — ALUM & MAG HYDROXIDE-SIMETH 200-200-20 MG/5ML PO SUSP: 15.0000 mL | ORAL | Status: DC | PRN

## 2017-06-25 MED ORDER — METOPROLOL TARTRATE 25 MG PO TABS
25.0000 mg | ORAL_TABLET | Freq: Every day | ORAL | Status: DC
  Administered 2017-06-26: 25 mg via ORAL
  Filled 2017-06-25 (×2): qty 1

## 2017-06-25 MED ORDER — DIPHENHYDRAMINE HCL 12.5 MG/5ML PO ELIX: 12.5000 mg | ORAL_SOLUTION | ORAL | Status: DC | PRN

## 2017-06-25 MED ORDER — CARVEDILOL 12.5 MG PO TABS
12.5000 mg | ORAL_TABLET | Freq: Two times a day (BID) | ORAL | Status: DC
  Administered 2017-06-25 – 2017-06-26 (×3): 12.5 mg via ORAL
  Filled 2017-06-25 (×3): qty 1

## 2017-06-25 MED ORDER — TRANEXAMIC ACID 1000 MG/10ML IV SOLN
1000.0000 mg | Freq: Once | INTRAVENOUS | Status: AC
  Administered 2017-06-25: 1000 mg via INTRAVENOUS
  Filled 2017-06-25: qty 1100

## 2017-06-25 MED ORDER — HYDROCODONE-ACETAMINOPHEN 7.5-325 MG PO TABS: 1.0000 | ORAL_TABLET | ORAL | Status: DC | PRN

## 2017-06-25 MED ORDER — POLYETHYLENE GLYCOL 3350 17 G PO PACK: 17.0000 g | PACK | Freq: Two times a day (BID) | ORAL | 0 refills | Status: DC

## 2017-06-25 MED ORDER — DOCUSATE SODIUM 100 MG PO CAPS: 100.0000 mg | ORAL_CAPSULE | Freq: Two times a day (BID) | ORAL | 0 refills | Status: DC

## 2017-06-25 MED ORDER — PHENYLEPHRINE 40 MCG/ML (10ML) SYRINGE FOR IV PUSH (FOR BLOOD PRESSURE SUPPORT)
PREFILLED_SYRINGE | INTRAVENOUS | Status: DC | PRN
  Administered 2017-06-25 (×4): 80 ug via INTRAVENOUS

## 2017-06-25 MED ORDER — CHLORHEXIDINE GLUCONATE 4 % EX LIQD: 60.0000 mL | Freq: Once | CUTANEOUS | Status: DC

## 2017-06-25 SURGICAL SUPPLY — 35 items
BAG DECANTER FOR FLEXI CONT (MISCELLANEOUS) IMPLANT
BAG ZIPLOCK 12X15 (MISCELLANEOUS) IMPLANT
BLADE SAG 18X100X1.27 (BLADE) ×3 IMPLANT
CAPT HIP TOTAL 2 ×3 IMPLANT
CLOTH BEACON ORANGE TIMEOUT ST (SAFETY) ×3 IMPLANT
COVER PERINEAL POST (MISCELLANEOUS) ×3 IMPLANT
COVER SURGICAL LIGHT HANDLE (MISCELLANEOUS) ×3 IMPLANT
DERMABOND ADVANCED (GAUZE/BANDAGES/DRESSINGS) ×2
DERMABOND ADVANCED .7 DNX12 (GAUZE/BANDAGES/DRESSINGS) ×1 IMPLANT
DRAPE STERI IOBAN 125X83 (DRAPES) ×3 IMPLANT
DRAPE U-SHAPE 47X51 STRL (DRAPES) ×6 IMPLANT
DRESSING AQUACEL AG SP 3.5X10 (GAUZE/BANDAGES/DRESSINGS) ×1 IMPLANT
DRSG AQUACEL AG SP 3.5X10 (GAUZE/BANDAGES/DRESSINGS) ×3
DURAPREP 26ML APPLICATOR (WOUND CARE) ×3 IMPLANT
ELECT REM PT RETURN 15FT ADLT (MISCELLANEOUS) ×3 IMPLANT
GLOVE BIO SURGEON STRL SZ8 (GLOVE) ×3 IMPLANT
GLOVE BIOGEL M STRL SZ7.5 (GLOVE) IMPLANT
GLOVE BIOGEL PI IND STRL 7.5 (GLOVE) ×3 IMPLANT
GLOVE BIOGEL PI IND STRL 8.5 (GLOVE) IMPLANT
GLOVE BIOGEL PI INDICATOR 7.5 (GLOVE) ×6
GLOVE BIOGEL PI INDICATOR 8.5 (GLOVE)
GLOVE ORTHO TXT STRL SZ7.5 (GLOVE) ×3 IMPLANT
GOWN STRL REUS W/TWL LRG LVL3 (GOWN DISPOSABLE) ×3 IMPLANT
GOWN STRL REUS W/TWL XL LVL4 (GOWN DISPOSABLE) ×9 IMPLANT
HOLDER FOLEY CATH W/STRAP (MISCELLANEOUS) ×3 IMPLANT
PACK ANTERIOR HIP CUSTOM (KITS) ×3 IMPLANT
SUT MNCRL AB 4-0 PS2 18 (SUTURE) ×3 IMPLANT
SUT STRATAFIX 0 PDS 27 VIOLET (SUTURE) ×3
SUT VIC AB 1 CT1 36 (SUTURE) ×9 IMPLANT
SUT VIC AB 2-0 CT1 27 (SUTURE) ×4
SUT VIC AB 2-0 CT1 TAPERPNT 27 (SUTURE) ×2 IMPLANT
SUTURE STRATFX 0 PDS 27 VIOLET (SUTURE) ×1 IMPLANT
TRAY FOLEY W/METER SILVER 16FR (SET/KITS/TRAYS/PACK) IMPLANT
WATER STERILE IRR 1000ML POUR (IV SOLUTION) ×6 IMPLANT
YANKAUER SUCT BULB TIP 10FT TU (MISCELLANEOUS) IMPLANT

## 2017-06-25 NOTE — Anesthesia Preprocedure Evaluation (Addendum)
Anesthesia Evaluation  Patient identified by MRN, date of birth, ID band Patient awake    Reviewed: Allergy & Precautions, NPO status , Patient's Chart, lab work & pertinent test results, reviewed documented beta blocker date and time   Airway Mallampati: III  TM Distance: >3 FB Neck ROM: Full    Dental no notable dental hx.    Pulmonary neg pulmonary ROS,    Pulmonary exam normal breath sounds clear to auscultation       Cardiovascular hypertension, Pt. on medications and Pt. on home beta blockers Normal cardiovascular exam+ dysrhythmias Atrial Fibrillation  Rhythm:Regular Rate:Normal  ECG: A-fib, RBBB, rate 72 ECHO:  Left ventricle: The cavity size was normal. Wall thickness was increased in a pattern of moderate LVH. Systolic function was normal. The estimated ejection fraction was in the range of 60% to 65%. Wall motion was normal; there were no regional wall motion abnormalities. The study is not technically sufficient to allow evaluation of LV diastolic function. Aortic valve: Trileaflet; mildly thickened leaflets. There was mild regurgitation. Mitral valve: There was mild regurgitation. Left atrium: The atrium was moderately dilated. Right ventricle: Systolic function was mildly reduced. Tricuspid valve: There was mild regurgitation.    Neuro/Psych negative neurological ROS  negative psych ROS   GI/Hepatic negative GI ROS, Neg liver ROS,   Endo/Other  negative endocrine ROS  Renal/GU Renal disease     Musculoskeletal  (+) Arthritis , Osteoarthritis,    Abdominal (+) + obese,   Peds  Hematology negative hematology ROS (+)   Anesthesia Other Findings Right hip osteoarthritis  Reproductive/Obstetrics                            Anesthesia Physical Anesthesia Plan  ASA: III  Anesthesia Plan: Spinal   Post-op Pain Management:    Induction: Intravenous  PONV Risk Score and Plan:  1 and Ondansetron, Treatment may vary due to age or medical condition and Midazolam  Airway Management Planned: Natural Airway  Additional Equipment:   Intra-op Plan:   Post-operative Plan:   Informed Consent: I have reviewed the patients History and Physical, chart, labs and discussed the procedure including the risks, benefits and alternatives for the proposed anesthesia with the patient or authorized representative who has indicated his/her understanding and acceptance.   Dental advisory given  Plan Discussed with: CRNA  Anesthesia Plan Comments:         Anesthesia Quick Evaluation

## 2017-06-25 NOTE — Op Note (Signed)
NAME:  Luis Nunez                ACCOUNT NO.: [id number]      MEDICAL RECORD NO.: [id number]      FACILITY:  [facility]      PHYSICIAN:  Shelda Pal  DATE OF BIRTH:  Jun 24, 1955     DATE OF PROCEDURE:  06/25/2017                                 OPERATIVE REPORT         PREOPERATIVE DIAGNOSIS: Right  hip osteoarthritis.      POSTOPERATIVE DIAGNOSIS:  Right hip osteoarthritis.      PROCEDURE:  Right total hip replacement through an anterior approach   utilizing DePuy THR system, component size 56mm pinnacle cup, a size 36+4 neutral   Altrex liner, a size 5 Hi Tri Lock stem with a 36+1.5 delta ceramic   ball.      SURGEON:  Madlyn Frankel. Charlann Boxer, M.D.      ASSISTANT:  Leilani Able, PA-C     ANESTHESIA:  Spinal.      SPECIMENS:  None.      COMPLICATIONS:  None.      BLOOD LOSS:  500 cc     DRAINS:  None.      INDICATION OF THE PROCEDURE:  Luis Nunez is a 62 y.o. male who had   presented to office for evaluation of right hip pain.  Radiographs revealed   progressive degenerative changes with bone-on-bone   articulation to the  hip joint.  The patient had painful limited range of   motion significantly affecting their overall quality of life.  The patient was failing to    respond to conservative measures, and at this point was ready   to proceed with more definitive measures.  The patient has noted progressive   degenerative changes in his hip, progressive problems and dysfunction   with regarding the hip prior to surgery.  Consent was obtained for   benefit of pain relief.  Specific risk of infection, DVT, component   failure, dislocation, need for revision surgery, as well discussion of   the anterior versus posterior approach were reviewed.  Consent was   obtained for benefit of anterior pain relief through an anterior   approach.      PROCEDURE IN DETAIL:  The patient was brought to operative theater.   Once adequate anesthesia, preoperative  antibiotics, 2 gm of Ancef, 1 gm of Tranexamic Acid, and 10 mg of Decadron administered.   The patient was positioned supine on the OSI Hanna table.  Once adequate   padding of boney process was carried out, we had predraped out the hip, and  used fluoroscopy to confirm orientation of the pelvis and position.      The right hip was then prepped and draped from proximal iliac crest to   mid thigh with shower curtain technique.      Time-out was performed identifying the patient, planned procedure, and   extremity.     An incision was then made 2 cm distal and lateral to the   anterior superior iliac spine extending over the orientation of the   tensor fascia lata muscle and sharp dissection was carried down to the   fascia of the muscle and protractor placed in the soft tissues.      The fascia was  then incised.  The muscle belly was identified and swept   laterally and retractor placed along the superior neck.  Following   cauterization of the circumflex vessels and removing some pericapsular   fat, a second cobra retractor was placed on the inferior neck.  A third   retractor was placed on the anterior acetabulum after elevating the   anterior rectus.  A L-capsulotomy was along the line of the   superior neck to the trochanteric fossa, then extended proximally and   distally.  Tag sutures were placed and the retractors were then placed   intracapsular.  We then identified the trochanteric fossa and   orientation of my neck cut, confirmed this radiographically   and then made a neck osteotomy with the femur on traction.  The femoral   head was removed without difficulty or complication.  Traction was let   off and retractors were placed posterior and anterior around the   acetabulum.      The labrum and foveal tissue were debrided.  I began reaming with a 46mm   reamer and reamed up to 55mm reamer with good bony bed preparation and a 56mm   cup was chosen.  The final 56mm Pinnacle  cup was then impacted under fluoroscopy  to confirm the depth of penetration and orientation with respect to   abduction.  A screw was placed followed by the hole eliminator.  Peri-acetabular osteophytes were excised from around the acetabulum to reduce chances of impingement and or dislocationThe final   36+4 neutral Altrex liner was impacted with good visualized rim fit.  The cup was positioned anatomically within the acetabular portion of the pelvis.      At this point, the femur was rolled at 80 degrees.  Further capsule was   released off the inferior aspect of the femoral neck.  I then   released the superior capsule proximally.  The hook was placed laterally   along the femur and elevated manually and held in position with the bed   hook.  The leg was then extended and adducted with the leg rolled to 100   degrees of external rotation.  Once the proximal femur was fully   exposed, I used a box osteotome to set orientation.  I then began   broaching with the starting chili pepper broach and passed this by hand and then broached up to 5 (matching the other hip).  With the 5 broach in place I chose a high offset neck and did several trial reductions.  The offset was appropriate, leg lengths   appeared to be equal best matched with the +1.5 head ball confirmed radiographically.   Given these findings, I went ahead and dislocated the hip, repositioned all   retractors and positioned the right hip in the extended and abducted position.  The final 5Hi Tri Lock stem was   chosen and it was impacted down to the level of neck cut.  Based on this   and the trial reduction, a 36+1.5 delta ceramic ball was chosen and   impacted onto a clean and dry trunnion, and the hip was reduced.  The   hip had been irrigated throughout the case again at this point.  I did   reapproximate the superior capsular leaflet to the anterior leaflet   using #1 Vicryl.  The fascia of the   tensor fascia lata muscle was  then reapproximated using #1 Vicryl and #0 Stratafix sutures.  The   remaining  wound was closed with 2-0 Vicryl and running 4-0 Monocryl.   The hip was cleaned, dried, and dressed sterilely using Dermabond and   Aquacel dressing.  He was then brought   to recovery room in stable condition tolerating the procedure well.    Leilani Able, PA-C was present for the entirety of the case involved from   preoperative positioning, perioperative retractor management, general   facilitation of the case, as well as primary wound closure as assistant.            Madlyn Frankel Charlann Boxer, M.D.        06/25/2017 1:06 PM

## 2017-06-25 NOTE — Transfer of Care (Signed)
Immediate Anesthesia Transfer of Care Note  Patient: Luis Nunez  Procedure(s) Performed: RIGHT TOTAL HIP ARTHROPLASTY ANTERIOR APPROACH (Right Hip)  Patient Location: PACU  Anesthesia Type:Spinal  Level of Consciousness: awake, alert , oriented and patient cooperative  Airway & Oxygen Therapy: Patient Spontanous Breathing and Patient connected to face mask oxygen  Post-op Assessment: Report given to RN and Post -op Vital signs reviewed and stable  Post vital signs: Reviewed and stable  Last Vitals:  Vitals Value Taken Time  BP    Temp    Pulse    Resp    SpO2      Last Pain:  Vitals:   06/25/17 1224  TempSrc: Oral  PainSc:          Complications: No apparent anesthesia complications

## 2017-06-25 NOTE — Interval H&P Note (Signed)
History and Physical Interval Note:  06/25/2017 12:52 PM  Luis Nunez  has presented today for surgery, with the diagnosis of Right hip osteoarthritis  The various methods of treatment have been discussed with the patient and family. After consideration of risks, benefits and other options for treatment, the patient has consented to  Procedure(s) with comments: RIGHT TOTAL HIP ARTHROPLASTY ANTERIOR APPROACH (Right) - 70 mins as a surgical intervention .  The patient's history has been reviewed, patient examined, no change in status, stable for surgery.  I have reviewed the patient's chart and labs.  Questions were answered to the patient's satisfaction.     Shelda PalMatthew D Mcadoo Muzquiz

## 2017-06-25 NOTE — Anesthesia Postprocedure Evaluation (Signed)
Anesthesia Post Note  Patient: Luis Nunez  Procedure(s) Performed: RIGHT TOTAL HIP ARTHROPLASTY ANTERIOR APPROACH (Right Hip)     Patient location during evaluation: PACU Anesthesia Type: Spinal Level of consciousness: oriented and awake and alert Pain management: pain level controlled Vital Signs Assessment: post-procedure vital signs reviewed and stable Respiratory status: spontaneous breathing, respiratory function stable and patient connected to nasal cannula oxygen Cardiovascular status: blood pressure returned to baseline and stable Postop Assessment: no headache, no backache, no apparent nausea or vomiting and spinal receding Anesthetic complications: no    Last Vitals:  Vitals:   06/25/17 1600 06/25/17 1615  BP: (!) 128/92 (!) 157/99  Pulse: 63 60  Resp: 12 16  Temp:    SpO2: 100% 100%    Last Pain:  Vitals:   06/25/17 1600  TempSrc:   PainSc: 0-No pain                 Ryan P Ellender

## 2017-06-25 NOTE — Anesthesia Procedure Notes (Signed)
Spinal  Start time: 06/25/2017 12:56 PM End time: 06/25/2017 12:59 PM Staffing Resident/CRNA: Victoriano Lain, CRNA Performed: resident/CRNA  Preanesthetic Checklist Completed: patient identified, site marked, surgical consent, pre-op evaluation, timeout performed, IV checked, risks and benefits discussed and monitors and equipment checked Spinal Block Patient position: sitting Prep: ChloraPrep and site prepped and draped Patient monitoring: heart rate, continuous pulse ox and blood pressure Approach: midline Location: L3-4 Injection technique: single-shot Needle Needle gauge: 24 G Needle length: 10 cm Assessment Sensory level: T4 Additional Notes Pt placed in sitting position for spinal placement. Spinal kit expiration date checked and verified. One attempt. + CSF, - heme. Pt tolerated well.

## 2017-06-26 LAB — CBC
HCT: 33.6 % — ABNORMAL LOW (ref 39.0–52.0)
Hemoglobin: 11.3 g/dL — ABNORMAL LOW (ref 13.0–17.0)
MCH: 29.6 pg (ref 26.0–34.0)
MCHC: 33.6 g/dL (ref 30.0–36.0)
MCV: 88 fL (ref 78.0–100.0)
PLATELETS: 234 10*3/uL (ref 150–400)
RBC: 3.82 MIL/uL — AB (ref 4.22–5.81)
RDW: 14 % (ref 11.5–15.5)
WBC: 11.5 10*3/uL — AB (ref 4.0–10.5)

## 2017-06-26 LAB — BASIC METABOLIC PANEL
ANION GAP: 13 (ref 5–15)
BUN: 30 mg/dL — AB (ref 6–20)
CALCIUM: 8.9 mg/dL (ref 8.9–10.3)
CO2: 23 mmol/L (ref 22–32)
Chloride: 99 mmol/L — ABNORMAL LOW (ref 101–111)
Creatinine, Ser: 1.57 mg/dL — ABNORMAL HIGH (ref 0.61–1.24)
GFR calc Af Amer: 53 mL/min — ABNORMAL LOW (ref >60)
GFR, EST NON AFRICAN AMERICAN: 46 mL/min — AB (ref >60)
Glucose, Bld: 138 mg/dL — ABNORMAL HIGH (ref 65–99)
POTASSIUM: 3.7 mmol/L (ref 3.5–5.1)
SODIUM: 135 mmol/L (ref 135–145)

## 2017-06-26 NOTE — Progress Notes (Signed)
Patient d/c home, instructions reviewed with pt.  Written rx given to pt.

## 2017-06-26 NOTE — Evaluation (Signed)
Physical Therapy Evaluation Patient Details Name: Jonne PlyJerry Momon MRN: [id number] DOB: 08-17-1955 Today's Date: 06/26/2017   History of Present Illness  R DATHA< LDATHA 3 months ago  Clinical Impression  The patient is mobilizing well. Will see second session , then plans Dc Pt admitted with above diagnosis. Pt currently with functional limitations due to the deficits listed below (see PT Problem List).  Pt will benefit from skilled PT to increase their independence and safety with mobility to allow discharge to the venue listed below.       Follow Up Recommendations Follow surgeon's recommendation for DC plan and follow-up therapies    Equipment Recommendations  None recommended by PT    Recommendations for Other Services       Precautions / Restrictions Precautions Precautions: None Restrictions Weight Bearing Restrictions: No      Mobility  Bed Mobility Overal bed mobility: Modified Independent             General bed mobility comments: used sheet around right foot  Transfers Overall transfer level: Needs assistance Equipment used: Rolling walker (2 wheeled) Transfers: Sit to/from Stand Sit to Stand: Supervision         General transfer comment: cues for tecnique  Ambulation/Gait Ambulation/Gait assistance: Min guard Ambulation Distance (Feet): 150 Feet Assistive device: Rolling walker (2 wheeled) Gait Pattern/deviations: Step-to pattern;Decreased step length - right;Decreased stance time - right     General Gait Details: gradual improvement for advancing the right leg, cues for posture  Stairs            Wheelchair Mobility    Modified Rankin (Stroke Patients Only)       Balance                                             Pertinent Vitals/Pain Pain Assessment: 0-10 Pain Score: 4  Pain Location: right hip Pain Descriptors / Indicators: Sore Pain Intervention(s): Premedicated before session;Monitored during  session;Ice applied    Home Living Family/patient expects to be discharged to:: Private residence Living Arrangements: Spouse/significant other Available Help at Discharge: Friend(s) Type of Home: House Home Access: Level entry     Home Layout: One level Home Equipment: Environmental consultantWalker - 2 wheels;Cane - single point      Prior Function Level of Independence: Independent               Hand Dominance        Extremity/Trunk Assessment   Upper Extremity Assessment Upper Extremity Assessment: Overall WFL for tasks assessed    Lower Extremity Assessment Lower Extremity Assessment: RLE deficits/detail RLE Deficits / Details: decreased  ability to flex without assist and to advance the leg       Communication      Cognition Arousal/Alertness: Awake/alert Behavior During Therapy: [facility] for tasks assessed/performed                                          General Comments      Exercises Total Joint Exercises Ankle Circles/Pumps: AROM;Both;10 reps;Supine Short Arc Quad: AAROM;Right;10 reps;Supine Heel Slides: AAROM;Right;10 reps;Supine Hip ABduction/ADduction: AAROM;10 reps;Supine   Assessment/Plan    PT Assessment Patient needs continued PT services  PT Problem List Decreased strength;Decreased range of motion;Decreased activity tolerance;Decreased mobility;Pain  PT Treatment Interventions DME instruction;Gait training;Functional mobility training;Therapeutic activities;Patient/family education;Therapeutic exercise    PT Goals (Current goals can be found in the Care Plan section)  Acute Rehab PT Goals Patient Stated Goal: go home PT Goal Formulation: With patient Time For Goal Achievement: 06/28/17 Potential to Achieve Goals: Good    Frequency 7X/week   Barriers to discharge        Co-evaluation               AM-PAC PT "6 Clicks" Daily Activity  Outcome Measure Difficulty turning over in bed (including adjusting bedclothes,  sheets and blankets)?: A Little Difficulty moving from lying on back to sitting on the side of the bed? : A Little Difficulty sitting down on and standing up from a chair with arms (e.g., wheelchair, bedside commode, etc,.)?: A Little Help needed moving to and from a bed to chair (including a wheelchair)?: A Little Help needed walking in hospital room?: A Little Help needed climbing 3-5 steps with a railing? : A Little 6 Click Score: 18    End of Session   Activity Tolerance: Patient tolerated treatment well Patient left: in chair;with call bell/phone within reach;with chair alarm set Nurse Communication: Mobility status PT Visit Diagnosis: Unsteadiness on feet (R26.81);Pain Pain - Right/Left: Right Pain - part of body: Hip    Time: 1610-9604 PT Time Calculation (min) (ACUTE ONLY): 44 min   Charges:   PT Evaluation $PT Eval Low Complexity: 1 Low PT Treatments $Gait Training: 8-22 mins $Therapeutic Exercise: 8-22 mins   PT G CodesBlanchard Kelch PT [phone]   Rada Hay 06/26/2017, 10:25 AM

## 2017-06-26 NOTE — Discharge Instructions (Signed)

## 2017-06-26 NOTE — Progress Notes (Signed)
     Subjective: 1 Day Post-Op Procedure(s) (LRB): RIGHT TOTAL HIP ARTHROPLASTY ANTERIOR APPROACH (Right)   Patient reports pain as mild, pain controlled.  No events throughout the night.  Dr. Charlann Boxerlin described the procedure and findings.  Discussed his activity levels, he has been through the other hip recently and knows what he is doing. We did discuss for him to follow up with is cardiologist as the way he is taking his meds are different than the last cardiology note. He states that he will to clarify his medications.   Ready to be discharged home.  Objective:   VITALS:   Vitals:   06/26/17 0037 06/26/17 0426  BP: 128/83 (!) 147/97  Pulse: 72 61  Resp: 15 18  Temp: 98.3 F (36.8 C) 98.1 F (36.7 C)  SpO2: 98% 97%    Dorsiflexion/Plantar flexion intact Incision: dressing C/D/I No cellulitis present Compartment soft  LABS Recent Labs    06/26/17 0606  HGB 11.3*  HCT 33.6*  WBC 11.5*  PLT 234    Recent Labs    06/26/17 0606  NA 135  K 3.7  BUN 30*  CREATININE 1.57*  GLUCOSE 138*     Assessment/Plan: 1 Day Post-Op Procedure(s) (LRB): RIGHT TOTAL HIP ARTHROPLASTY ANTERIOR APPROACH (Right) Foley cath d/c'ed Advance diet Up with therapy D/C IV fluids Discharge home [facility]. Follow up with OLIN,Jotham Ahn D in 2 weeks.  Contact information:  [facility] [address] [place] [id number] [phone]    Obese (BMI 30-39.9) Estimated body mass index is 31.14 kg/m as calculated from the following:   Height as of this encounter: 5\' 10"  (1.778 m).   Weight as of this encounter: 98.4 kg (217 lb). Patient also counseled that weight may inhibit the healing process Patient counseled that losing weight will help with future health issues    Anastasio AuerbachMatthew S. Deloma Spindle   PAC  06/26/2017, 8:27 AM

## 2017-07-01 NOTE — Discharge Summary (Signed)
Physician Discharge Summary  Patient ID: Luis Nunez MRN: [id number] DOB/AGE: [birth year] 63 y.o.  Admit date: 06/25/2017 Discharge date: 06/26/2017   Procedures:  Procedure(s) (LRB): RIGHT TOTAL HIP ARTHROPLASTY ANTERIOR APPROACH (Right)  Attending Physician:  Dr. Durene Romans   Admission Diagnoses:   Right hip primary OA / pain  Discharge Diagnoses:  Principal Problem:   S/P right THA, AA Active Problems:   Obese  Past Medical History:  Diagnosis Date  . Dysrhythmia 02/2017   A flutter  . Hypertension     HPI:     Luis Nunez, 62 y.o. male, has a history of pain and functional disability in the right hip(s) due to arthritis and patient has failed non-surgical conservative treatments for greater than 12 weeks to include NSAID's and/or analgesics and activity modification.  Onset of symptoms was gradual starting 4-5 years ago with gradually worsening course since that time.The patient noted prior procedures of the hip to include arthroplasty on the left hip per Dr. Charlann Boxer in Jan 2019.  Patient currently rates pain in the right hip at 3 out of 10 with activity. Patient has worsening of pain with activity and weight bearing, trendelenberg gait, pain that interfers with activities of daily living and pain with passive range of motion. Patient has evidence of periarticular osteophytes and joint space narrowing by imaging studies. This condition presents safety issues increasing the risk of falls. There is no current active infection.  Risks, benefits and expectations were discussed with the patient.  Risks including but not limited to the risk of anesthesia, blood clots, nerve damage, blood vessel damage, failure of the prosthesis, infection and up to and including death.  Patient understand the risks, benefits and expectations and wishes to proceed with surgery.  PCP: Luis Nunez., MD   Discharged Condition: good  Hospital Course:  Patient underwent the above stated procedure  on 06/25/2017. Patient tolerated the procedure well and brought to the recovery room in good condition and subsequently to the floor.  POD #1 BP: 147/97 ; Pulse: 61 ; Temp: 98.1 F (36.7 C) ; Resp: 18 Patient reports pain as mild, pain controlled.  No events throughout the night.  Dr. Charlann Boxer described the procedure and findings.  Discussed his activity levels, he has been through the other hip recently and knows what he is doing. We did discuss for him to follow up with is cardiologist as the way he is taking his meds are different than the last cardiology note. He states that he will to clarify his medications.   Ready to be discharged home. Dorsiflexion/plantar flexion intact, incision: dressing C/D/I, no cellulitis present and compartment soft.   LABS  Basename    HGB     11.3  HCT     33.6    Discharge Exam: General appearance: alert, cooperative and no distress Extremities: Homans sign is negative, no sign of DVT, no edema, redness or tenderness in the calves or thighs and no ulcers, gangrene or trophic changes  Disposition:  Home with follow up in 2 weeks   Follow-up Information    Durene Romans, MD. Schedule an appointment as soon as possible for a visit in 2 weeks.   Specialty:  Orthopedic Surgery Contact information: [address] [place] [address] [phone]           Discharge Instructions    Call MD / Call 911   Complete by:  As directed    If you experience chest pain or shortness  of breath, CALL 911 and be transported to the hospital emergency room.  If you develope a fever above 101 F, pus (white drainage) or increased drainage or redness at the wound, or calf pain, call your surgeon's office.   Change dressing   Complete by:  As directed    Maintain surgical dressing until follow up in the clinic. If the edges start to pull up, may reinforce with tape. If the dressing is no longer working, may remove and cover with gauze and tape, but must  keep the area dry and clean.  Call with any questions or concerns.   Constipation Prevention   Complete by:  As directed    Drink plenty of fluids.  Prune juice may be helpful.  You may use a stool softener, such as Colace (over the counter) 100 mg twice a day.  Use MiraLax (over the counter) for constipation as needed.   Diet - low sodium heart healthy   Complete by:  As directed    Discharge instructions   Complete by:  As directed    Maintain surgical dressing until follow up in the clinic. If the edges start to pull up, may reinforce with tape. If the dressing is no longer working, may remove and cover with gauze and tape, but must keep the area dry and clean.  Follow up in 2 weeks at [facility]. Call with any questions or concerns.   Increase activity slowly as tolerated   Complete by:  As directed    Weight bearing as tolerated with assist device (walker, cane, etc) as directed, use it as long as suggested by your surgeon or therapist, typically at least 4-6 weeks.   TED hose   Complete by:  As directed    Use stockings (TED hose) for 2 weeks on both leg(s).  You may remove them at night for sleeping.      Allergies as of 06/26/2017      Reactions   Penicillins Rash, Other (See Comments)   Has patient had a PCN reaction causing immediate rash, facial/tongue/throat swelling, SOB or lightheadedness with hypotension: yes Has patient had a PCN reaction causing severe rash involving mucus membranes or skin necrosis: No Has patient had a PCN reaction that required hospitalization No Has patient had a PCN reaction occurring within the last 10 years: No If all of the above answers are "NO", then may proceed with Cephalosporin use.      Medication List    TAKE these medications   amLODipine 10 MG tablet Commonly known as:  NORVASC Take 1 tablet (10 mg total) by mouth daily.   apixaban 5 MG Tabs tablet Commonly known as:  ELIQUIS Take 1 tablet (5 mg total) by mouth 2  (two) times daily.   carvedilol 12.5 MG tablet Commonly known as:  COREG Take 1 tablet (12.5 mg total) by mouth 2 (two) times daily.   docusate sodium 100 MG capsule Commonly known as:  COLACE Take 1 capsule (100 mg total) by mouth 2 (two) times daily.   ferrous sulfate 325 (65 FE) MG tablet Commonly known as:  FERROUSUL Take 1 tablet (325 mg total) by mouth 3 (three) times daily with meals.   HYDROcodone-acetaminophen 7.5-325 MG tablet Commonly known as:  NORCO Take 1-2 tablets by mouth every 4 (four) hours as needed for moderate pain.   lisinopril-hydrochlorothiazide 20-12.5 MG tablet Commonly known as:  PRINZIDE,ZESTORETIC Take 2 tablets by mouth daily.   methocarbamol 500 MG tablet Commonly known as:  ROBAXIN Take 1 tablet (500 mg total) by mouth every 6 (six) hours as needed for muscle spasms.   metoprolol tartrate 25 MG tablet Commonly known as:  LOPRESSOR Take 25 mg by mouth daily.   polyethylene glycol packet Commonly known as:  MIRALAX / GLYCOLAX Take 17 g by mouth 2 (two) times daily.            Discharge Care Instructions  (From admission, onward)        Start     Ordered   06/26/17 0000  Change dressing    Comments:  Maintain surgical dressing until follow up in the clinic. If the edges start to pull up, may reinforce with tape. If the dressing is no longer working, may remove and cover with gauze and tape, but must keep the area dry and clean.  Call with any questions or concerns.   06/26/17 0830       Signed: Anastasio Auerbach. Debrina Kizer   PA-C  07/01/2017, 10:07 AM

## 2017-07-03 ENCOUNTER — Encounter: Payer: Self-pay | Admitting: Cardiology

## 2017-07-04 NOTE — Telephone Encounter (Signed)
Pt and I agreed to speak next week

## 2017-07-16 NOTE — Telephone Encounter (Signed)
Left detailed message on cell phone (DPR on file) informing pt that AFLutter procedure date needs to be moved out further from ortho surgery. Advised pt to follow up with me Thursday or I would call him that day.

## 2017-07-18 ENCOUNTER — Encounter: Payer: Self-pay | Admitting: *Deleted

## 2017-07-18 ENCOUNTER — Telehealth: Payer: Self-pay | Admitting: Cardiology

## 2017-07-18 NOTE — Telephone Encounter (Signed)
Rescheduled AFlutter ablation for 08/16/17. Pt understands to keep appt w/ Dr. Elberta Fortis next week to go over instructions and complete pre procedure labs. Patient verbalized understanding and agreeable to plan.

## 2017-07-24 ENCOUNTER — Ambulatory Visit (INDEPENDENT_AMBULATORY_CARE_PROVIDER_SITE_OTHER): Payer: Medicare Other | Admitting: Cardiology

## 2017-07-24 ENCOUNTER — Encounter: Payer: Self-pay | Admitting: Cardiology

## 2017-07-24 VITALS — BP 136/100 | HR 61 | Ht 69.5 in | Wt 216.4 lb

## 2017-07-24 DIAGNOSIS — I483 Typical atrial flutter: Secondary | ICD-10-CM

## 2017-07-24 DIAGNOSIS — I10 Essential (primary) hypertension: Secondary | ICD-10-CM

## 2017-07-24 DIAGNOSIS — Z01812 Encounter for preprocedural laboratory examination: Secondary | ICD-10-CM | POA: Diagnosis not present

## 2017-07-24 LAB — BASIC METABOLIC PANEL
BUN / CREAT RATIO: 15 (ref 10–24)
BUN: 25 mg/dL (ref 8–27)
CALCIUM: 9.5 mg/dL (ref 8.6–10.2)
CHLORIDE: 101 mmol/L (ref 96–106)
CO2: 26 mmol/L (ref 20–29)
Creatinine, Ser: 1.69 mg/dL — ABNORMAL HIGH (ref 0.76–1.27)
GFR, EST AFRICAN AMERICAN: 49 mL/min/{1.73_m2} — AB (ref >59)
GFR, EST NON AFRICAN AMERICAN: 43 mL/min/{1.73_m2} — AB (ref >59)
Glucose: 90 mg/dL (ref 65–99)
POTASSIUM: 3.7 mmol/L (ref 3.5–5.2)
Sodium: 142 mmol/L (ref 134–144)

## 2017-07-24 LAB — CBC
HEMATOCRIT: 33.1 % — AB (ref 37.5–51.0)
HEMOGLOBIN: 11.2 g/dL — AB (ref 13.0–17.7)
MCH: 29.6 pg (ref 26.6–33.0)
MCHC: 33.8 g/dL (ref 31.5–35.7)
MCV: 88 fL (ref 79–97)
Platelets: 331 10*3/uL (ref 150–450)
RBC: 3.78 x10E6/uL — ABNORMAL LOW (ref 4.14–5.80)
RDW: 15.5 % — AB (ref 12.3–15.4)
WBC: 4.5 10*3/uL (ref 3.4–10.8)

## 2017-07-24 MED ORDER — CARVEDILOL 25 MG PO TABS: 25.0000 mg | ORAL_TABLET | Freq: Two times a day (BID) | ORAL | 6 refills | Status: DC

## 2017-07-24 NOTE — Addendum Note (Signed)
Addended by: Baird Lyons on: 07/24/2017 12:11 PM   Modules accepted: Orders

## 2017-07-24 NOTE — Progress Notes (Signed)
Electrophysiology Office Note   Date:  07/24/2017   ID:  [name], DOB Jun 18, 1955, MRN [id number]  PCP:  Louie Boston., MD  Cardiologist:  Purvis Sheffield Primary Electrophysiologist:  Regan Lemming, MD    Chief Complaint  Patient presents with  . Follow-up    AFlutter Pre ablaton check up     History of Present Illness: Luis Nunez is a 62 y.o. male who is being seen today for the evaluation of atrial flutter at the request of Prentice Docker. Presenting today for electrophysiology evaluation.  He has a past history of hypertension.  He was hospitalized in December due to atrial flutter.  He was started on metoprolol and Coumadin.  He was diagnosed with hypertension at the age of 32 when he entered the [employer].  He has had coronary angiography in the past at age 59 and was told that this was normal.  Is scheduled for ablation 08/16/2017.  Today, denies symptoms of palpitations, chest pain, shortness of breath, orthopnea, PND, lower extremity edema, claudication, dizziness, presyncope, syncope, bleeding, or neurologic sequela. The patient is tolerating medications without difficulties.     Past Medical History:  Diagnosis Date  . Dysrhythmia 02/2017   A flutter  . Hypertension    Past Surgical History:  Procedure Laterality Date  . 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 TOTAL HIP ARTHROPLASTY Right 06/25/2017   Procedure: RIGHT TOTAL HIP ARTHROPLASTY ANTERIOR APPROACH;  Surgeon: Durene Romans, MD;  Location: [facility];  Service: Orthopedics;  Laterality: Right;  70 mins     Current Outpatient Medications  Medication Sig Dispense Refill  . apixaban (ELIQUIS) 5 MG TABS tablet Take 1 tablet (5 mg total) by mouth 2 (two) times daily. 14 tablet 0  . carvedilol (COREG) 12.5 MG tablet Take 1 tablet (12.5 mg total)  by mouth 2 (two) times daily. 180 tablet 2  . ferrous sulfate (FERROUSUL) 325 (65 FE) MG tablet Take 1 tablet (325 mg total) by mouth 3 (three) times daily with meals.  3  . lisinopril-hydrochlorothiazide (PRINZIDE,ZESTORETIC) 20-12.5 MG tablet Take 2 tablets by mouth daily.     . metoprolol tartrate (LOPRESSOR) 25 MG tablet Take 25 mg by mouth daily.     No current facility-administered medications for this visit.     Allergies:   Penicillins   Social History:  The patient  reports that he has never smoked. His smokeless tobacco use includes snuff. He reports that he drinks alcohol. He reports that he has current or past drug history. Drugs: Cocaine and Marijuana.   Family History:  The patient's family history includes Bone cancer in his brother; Breast cancer in his sister; Colon cancer in his brother; High blood pressure in his sister.   ROS:  Please see the history of present illness.   Otherwise, review of systems is positive for none.   All other systems are reviewed and negative.   PHYSICAL EXAM: VS:  BP (!) 136/100 (BP Location: Right Arm)   Pulse 61   Ht 5' 9.5" (1.765 m)   Wt 216 lb 6.4 oz (98.2 kg)   SpO2 96%   BMI 31.50 kg/m  , BMI Body mass index is 31.5 kg/m. GEN: Well nourished, well developed, in no acute distress  HEENT: normal  Neck: no JVD, carotid bruits, or masses Cardiac: RRR; no murmurs,  rubs, or gallops,no edema  Respiratory:  clear to auscultation bilaterally, normal work of breathing GI: soft, nontender, nondistended, + BS MS: no deformity or atrophy  Skin: warm and dry Neuro:  Strength and sensation are intact Psych: euthymic mood, full affect  EKG:  EKG is not ordered today. Personal review of the ekg ordered 02/14/18 shows atrial flutter, right bundle branch block  Recent Labs: 06/26/2017: BUN 30; Creatinine, Ser 1.57; Hemoglobin 11.3; Platelets 234; Potassium 3.7; Sodium 135    Lipid Panel  No results found for: CHOL, TRIG, HDL, CHOLHDL, VLDL,  LDLCALC, LDLDIRECT   Wt Readings from Last 3 Encounters:  07/24/17 216 lb 6.4 oz (98.2 kg)  06/25/17 217 lb (98.4 kg)  06/19/17 217 lb (98.4 kg)      Other studies Reviewed: Additional studies/ records that were reviewed today include: TTE 02/20/17  Review of the above records today demonstrates:  - Left ventricle: The cavity size was normal. Wall thickness was   increased in a pattern of moderate LVH. Systolic function was   normal. The estimated ejection fraction was in the range of 60%   to 65%. Wall motion was normal; there were no regional wall   motion abnormalities. The study is not technically sufficient to   allow evaluation of LV diastolic function. - Aortic valve: Trileaflet; mildly thickened leaflets. There was   mild regurgitation. - Mitral valve: There was mild regurgitation. - Left atrium: The atrium was moderately dilated. - Right ventricle: Systolic function was mildly reduced. - Tricuspid valve: There was mild regurgitation.   ASSESSMENT AND PLAN:  1.  Atrial flutter: Currently on Eliquis.  Ablation scheduled 08/16/2017.  Risks and benefits of ablation discussed.  Risks include bleeding, tamponade, heart block, stroke.  Patient understands these risks and is agreed to the procedure.  2.  Hypertension: Blood pressure remains elevated today.  We Angelice Piech stop metoprolol and increase carvedilol to 25 mg.    Current medicines are reviewed at length with the patient today.   The patient does not have concerns regarding his medicines.  The following changes were made today: Metoprolol, increase carvedilol  Labs/ tests ordered today include:  No orders of the defined types were placed in this encounter.    Disposition:   FU with Lalana Wachter 1.5 months  Signed, Teniya Filter Jorja Loa, MD  07/24/2017 11:17 AM     [facility] [address] [place] [address] [phone] (office) [phone] (fax)

## 2017-07-24 NOTE — Addendum Note (Signed)
Addended by: Baird Lyons on: 07/24/2017 11:35 AM   Modules accepted: Orders

## 2017-07-24 NOTE — Patient Instructions (Addendum)
Medication Instructions:  Your physician has recommended you make the following change in your medication:  1. STOP Metoprolol Tartrate (Lopressor) 2. INCREASE Carvedilol to 25 mg twice daily  Labwork: Pre procedure labs today: BMET & CBC  Testing/Procedures: Your physician has recommended that you have an ablation. Catheter ablation is a medical procedure used to treat some cardiac arrhythmias (irregular heartbeats). During catheter ablation, a long, thin, flexible tube is put into a blood vessel in your groin (upper thigh), or neck. This tube is called an ablation catheter. It is then guided to your heart through the blood vessel. Radio frequency waves destroy small areas of heart tissue where abnormal heartbeats may cause an arrhythmia to start.    Instructions for your ablation: 1. Please arrive at the [facility], Main Entrance "A", of [facility] at 9:30 am  on 08/16/2017. 2. Do not eat or drink after midnight the night prior to the procedure. 3. Do not miss any doses of ELIQUIS prior to the morning of the procedure.  4. Do not take any medications the morning of the procedure. 5. You will shaved for this procedure (if needed). Typically both groins, chest and back  are shaven. We ask that you shave these areas yourself at home 1-2 days prior  to the procedure. If you are uncomfortable/unable, then hospital staff will shave  these areas the morning of your procedure. 6. Plan for an overnight stay in the hospital. 7. You will need someone to drive you home at discharge.   Follow-Up: Your physician recommends that you schedule a follow-up appointment in: 4 weeks, after your procedure on 08/16/17, with Dr. Elberta Fortis.  * If you need a refill on your cardiac medications before your next appointment, please call your pharmacy.   *Please note that any paperwork needing to be filled out by the provider will need to be addressed at the front desk prior to seeing the provider. Please note  that any FMLA, disability or other documents regarding health condition is subject to a $25.00 charge that must be received prior to completion of paperwork in the form of a money order or check.  Thank you for choosing [facility]!!   Dory Horn, RN [phone]  Any Other Special Instructions Will Be Listed Below (If Applicable).   Cardiac Ablation Cardiac ablation is a procedure to disable (ablate) a small amount of heart tissue in very specific places. The heart has many electrical connections. Sometimes these connections are abnormal and can cause the heart to beat very fast or irregularly. Ablating some of the problem areas can improve the heart rhythm or return it to normal. Ablation may be done for people who:  Have Wolff-Parkinson-White syndrome.  Have fast heart rhythms (tachycardia).  Have taken medicines for an abnormal heart rhythm (arrhythmia) that were not effective or caused side effects.  Have a high-risk heartbeat that may be life-threatening.  During the procedure, a small incision is made in the neck or the groin, and a long, thin, flexible tube (catheter) is inserted into the incision and moved to the heart. Small devices (electrodes) on the tip of the catheter will send out electrical currents. A type of X-ray (fluoroscopy) will be used to help guide the catheter and to provide images of the heart. Tell a health care provider about:  Any allergies you have.  All medicines you are taking, including vitamins, herbs, eye drops, creams, and over-the-counter medicines.  Any problems you or family members have had with anesthetic  medicines.  Any blood disorders you have.  Any surgeries you have had.  Any medical conditions you have, such as kidney failure.  Whether you are pregnant or may be pregnant. What are the risks? Generally, this is a safe procedure. However, problems may occur, including:  Infection.  Bruising and bleeding at the catheter  insertion site.  Bleeding into the chest, especially into the sac that surrounds the heart. This is a serious complication.  Stroke or blood clots.  Damage to other structures or organs.  Allergic reaction to medicines or dyes.  Need for a permanent pacemaker if the normal electrical system is damaged. A pacemaker is a small computer that sends electrical signals to the heart and helps your heart beat normally.  The procedure not being fully effective. This may not be recognized until months later. Repeat ablation procedures are sometimes required.  What happens before the procedure?  Follow instructions from your health care provider about eating or drinking restrictions.  Ask your health care provider about: ? Changing or stopping your regular medicines. This is especially important if you are taking diabetes medicines or blood thinners. ? Taking medicines such as aspirin and ibuprofen. These medicines can thin your blood. Do not take these medicines before your procedure if your health care provider instructs you not to.  Plan to have someone take you home from the hospital or clinic.  If you will be going home right after the procedure, plan to have someone with you for 24 hours. What happens during the procedure?  To lower your risk of infection: ? Your health care team will wash or sanitize their hands. ? Your skin will be washed with soap. ? Hair may be removed from the incision area.  An IV tube will be inserted into one of your veins.  You will be given a medicine to help you relax (sedative).  The skin on your neck or groin will be numbed.  An incision will be made in your neck or your groin.  A needle will be inserted through the incision and into a large vein in your neck or groin.  A catheter will be inserted into the needle and moved to your heart.  Dye may be injected through the catheter to help your surgeon see the area of the heart that needs  treatment.  Electrical currents will be sent from the catheter to ablate heart tissue in desired areas. There are three types of energy that may be used to ablate heart tissue: ? Heat (radiofrequency energy). ? Laser energy. ? Extreme cold (cryoablation).  When the necessary tissue has been ablated, the catheter will be removed.  Pressure will be held on the catheter insertion area to prevent excessive bleeding.  A bandage (dressing) will be placed over the catheter insertion area. The procedure may vary among health care providers and hospitals. What happens after the procedure?  Your blood pressure, heart rate, breathing rate, and blood oxygen level will be monitored until the medicines you were given have worn off.  Your catheter insertion area will be monitored for bleeding. You will need to lie still for a few hours to ensure that you do not bleed from the catheter insertion area.  Do not drive for 24 hours or as long as directed by your health care provider. Summary  Cardiac ablation is a procedure to disable (ablate) a small amount of heart tissue in very specific places. Ablating some of the problem areas can improve the heart rhythm  or return it to normal.  During the procedure, electrical currents will be sent from the catheter to ablate heart tissue in desired areas. This information is not intended to replace advice given to you by your health care provider. Make sure you discuss any questions you have with your health care provider. Document Released: 07/08/2008 Document Revised: 01/09/2016 Document Reviewed: 01/09/2016 Elsevier Interactive Patient Education  [employer].

## 2017-07-31 ENCOUNTER — Ambulatory Visit (HOSPITAL_COMMUNITY): Admit: 2017-07-31 | Payer: Medicare Other | Admitting: Cardiology

## 2017-07-31 ENCOUNTER — Encounter (HOSPITAL_COMMUNITY): Payer: Self-pay

## 2017-07-31 SURGERY — A-FLUTTER ABLATION
Anesthesia: Monitor Anesthesia Care

## 2017-08-16 ENCOUNTER — Encounter (HOSPITAL_COMMUNITY): Payer: Self-pay | Admitting: Certified Registered"

## 2017-08-16 ENCOUNTER — Ambulatory Visit (HOSPITAL_COMMUNITY)
Admission: RE | Admit: 2017-08-16 | Discharge: 2017-08-16 | Disposition: A | Discharge status: Home / Self Care | Payer: Medicare Other | Source: Ambulatory Visit | Attending: Cardiology | Admitting: Cardiology

## 2017-08-16 ENCOUNTER — Ambulatory Visit (HOSPITAL_COMMUNITY): Payer: Medicare Other | Admitting: Anesthesiology

## 2017-08-16 ENCOUNTER — Ambulatory Visit (HOSPITAL_COMMUNITY)
Admission: RE | Disposition: A | Discharge status: Home / Self Care | Payer: Self-pay | Source: Ambulatory Visit | Attending: Cardiology

## 2017-08-16 ENCOUNTER — Other Ambulatory Visit: Payer: Self-pay

## 2017-08-16 DIAGNOSIS — I10 Essential (primary) hypertension: Secondary | ICD-10-CM | POA: Insufficient documentation

## 2017-08-16 DIAGNOSIS — I483 Typical atrial flutter: Secondary | ICD-10-CM | POA: Diagnosis present

## 2017-08-16 DIAGNOSIS — I471 Supraventricular tachycardia: Secondary | ICD-10-CM | POA: Diagnosis not present

## 2017-08-16 DIAGNOSIS — Z880 Allergy status to penicillin: Secondary | ICD-10-CM | POA: Diagnosis not present

## 2017-08-16 DIAGNOSIS — Z7901 Long term (current) use of anticoagulants: Secondary | ICD-10-CM | POA: Insufficient documentation

## 2017-08-16 HISTORY — PX: ATRIAL FLUTTER ABLATION: SHX5733

## 2017-08-16 HISTORY — PX: A-FLUTTER ABLATION: EP1230

## 2017-08-16 SURGERY — A-FLUTTER ABLATION
Anesthesia: Monitor Anesthesia Care

## 2017-08-16 MED ORDER — CARVEDILOL 25 MG PO TABS
25.0000 mg | ORAL_TABLET | Freq: Two times a day (BID) | ORAL | Status: DC
  Administered 2017-08-16: 25 mg via ORAL
  Filled 2017-08-16: qty 1

## 2017-08-16 MED ORDER — CEFAZOLIN SODIUM-DEXTROSE 2-4 GM/100ML-% IV SOLN
INTRAVENOUS | Status: AC
  Filled 2017-08-16: qty 100

## 2017-08-16 MED ORDER — HYDROCHLOROTHIAZIDE 25 MG PO TABS: 25.0000 mg | ORAL_TABLET | Freq: Every day | ORAL | Status: DC

## 2017-08-16 MED ORDER — ONDANSETRON HCL 4 MG/2ML IJ SOLN: 4.0000 mg | Freq: Four times a day (QID) | INTRAMUSCULAR | Status: DC | PRN

## 2017-08-16 MED ORDER — CEFAZOLIN SODIUM-DEXTROSE 2-3 GM-%(50ML) IV SOLR
INTRAVENOUS | Status: DC | PRN
  Administered 2017-08-16: 2 g via INTRAVENOUS

## 2017-08-16 MED ORDER — SODIUM CHLORIDE 0.9 % IV SOLN
INTRAVENOUS | Status: DC
  Administered 2017-08-16: 11:00:00 via INTRAVENOUS

## 2017-08-16 MED ORDER — BUPIVACAINE HCL (PF) 0.25 % IJ SOLN
INTRAMUSCULAR | Status: AC
  Filled 2017-08-16: qty 30

## 2017-08-16 MED ORDER — HEPARIN (PORCINE) IN NACL 1000-0.9 UT/500ML-% IV SOLN
INTRAVENOUS | Status: AC
  Filled 2017-08-16: qty 500

## 2017-08-16 MED ORDER — SODIUM CHLORIDE 0.9% FLUSH: 3.0000 mL | Freq: Two times a day (BID) | INTRAVENOUS | Status: DC

## 2017-08-16 MED ORDER — HYDRALAZINE HCL 20 MG/ML IJ SOLN
INTRAMUSCULAR | Status: AC
  Filled 2017-08-16: qty 1

## 2017-08-16 MED ORDER — SODIUM CHLORIDE 0.9% FLUSH: 3.0000 mL | INTRAVENOUS | Status: DC | PRN

## 2017-08-16 MED ORDER — HEPARIN (PORCINE) IN NACL 2-0.9 UNITS/ML
INTRAMUSCULAR | Status: AC | PRN
  Administered 2017-08-16: 1000 mL

## 2017-08-16 MED ORDER — SODIUM CHLORIDE 0.9 % IV SOLN: 250.0000 mL | INTRAVENOUS | Status: DC | PRN

## 2017-08-16 MED ORDER — LISINOPRIL 40 MG PO TABS: 40.0000 mg | ORAL_TABLET | Freq: Every day | ORAL | Status: DC

## 2017-08-16 MED ORDER — LIDOCAINE 2% (20 MG/ML) 5 ML SYRINGE
INTRAMUSCULAR | Status: DC | PRN
  Administered 2017-08-16: 40 mg via INTRAVENOUS

## 2017-08-16 MED ORDER — PROPOFOL 500 MG/50ML IV EMUL
INTRAVENOUS | Status: DC | PRN
  Administered 2017-08-16: 150 ug/kg/min via INTRAVENOUS

## 2017-08-16 MED ORDER — APIXABAN 5 MG PO TABS: 5.0000 mg | ORAL_TABLET | Freq: Two times a day (BID) | ORAL | Status: DC

## 2017-08-16 MED ORDER — MIDAZOLAM HCL 2 MG/2ML IJ SOLN
INTRAMUSCULAR | Status: DC | PRN
  Administered 2017-08-16: 2 mg via INTRAVENOUS

## 2017-08-16 MED ORDER — ONDANSETRON HCL 4 MG/2ML IJ SOLN
INTRAMUSCULAR | Status: DC | PRN
  Administered 2017-08-16: 4 mg via INTRAVENOUS

## 2017-08-16 MED ORDER — FERROUS SULFATE 325 (65 FE) MG PO TABS: 325.0000 mg | ORAL_TABLET | Freq: Three times a day (TID) | ORAL | Status: DC

## 2017-08-16 MED ORDER — ACETAMINOPHEN 325 MG PO TABS: 650.0000 mg | ORAL_TABLET | ORAL | Status: DC | PRN

## 2017-08-16 MED ORDER — HYDRALAZINE HCL 20 MG/ML IJ SOLN
10.0000 mg | Freq: Once | INTRAMUSCULAR | Status: AC
  Administered 2017-08-16: 10 mg via INTRAVENOUS

## 2017-08-16 MED ORDER — LISINOPRIL-HYDROCHLOROTHIAZIDE 20-12.5 MG PO TABS: 2.0000 | ORAL_TABLET | Freq: Every day | ORAL | Status: DC

## 2017-08-16 MED ORDER — BUPIVACAINE HCL (PF) 0.25 % IJ SOLN
INTRAMUSCULAR | Status: DC | PRN
  Administered 2017-08-16: 25 mL

## 2017-08-16 SURGICAL SUPPLY — 10 items
BAG SNAP BAND KOVER 36X36 (MISCELLANEOUS) ×3 IMPLANT
CATH EZ STEER NAV 8MM D-F CUR (ABLATOR) ×3 IMPLANT
CATH JOSEPHSON QUAD-ALLRED 6FR (CATHETERS) ×3 IMPLANT
CATH WEBSTER BI DIR CS D-F CRV (CATHETERS) ×3 IMPLANT
PACK EP LATEX FREE (CUSTOM PROCEDURE TRAY) ×2
PACK EP LF (CUSTOM PROCEDURE TRAY) ×1 IMPLANT
PAD DEFIB LIFELINK (PAD) ×3 IMPLANT
SHEATH PINNACLE 6F 10CM (SHEATH) ×3 IMPLANT
SHEATH PINNACLE 7F 10CM (SHEATH) ×3 IMPLANT
SHEATH PINNACLE 8F 10CM (SHEATH) ×3 IMPLANT

## 2017-08-16 NOTE — Transfer of Care (Signed)
Immediate Anesthesia Transfer of Care Note  Patient: Luis Nunez  Procedure(s) Performed: A-FLUTTER ABLATION (N/A )  Patient Location: Cath Lab  Anesthesia Type:MAC  Level of Consciousness: drowsy and patient cooperative  Airway & Oxygen Therapy: Patient Spontanous Breathing  Post-op Assessment: Report given to RN  Post vital signs: Reviewed and stable  Last Vitals:  Vitals Value Taken Time  BP    Temp    Pulse    Resp    SpO2      Last Pain:  Vitals:   08/16/17 1004  TempSrc: Oral      Patients Stated Pain Goal: 4 (45/80/99 8338)  Complications: No apparent anesthesia complications

## 2017-08-16 NOTE — Progress Notes (Signed)
Patient wants to go home and has made arrangements for daughter pick up and stay over night.

## 2017-08-16 NOTE — Progress Notes (Signed)
Site area: Right groin a 6, 7, and 8 french venous sheath was removed  Site Prior to Removal:  Level 0  Pressure Applied For 20 MINUTES    Bedrest Beginning at 1435p  Manual:   Yes.    Patient Status During Pull:  stable  Post Pull Groin Site:  Level 0  Post Pull Instructions Given:  Yes.    Post Pull Pulses Present:  Yes.    Dressing Applied:  Yes.    Comments:  BP was treated with Hydralazine 10mg  IV   Now 148/93

## 2017-08-16 NOTE — Plan of Care (Signed)
  Problem: Activity: Goal: Risk for activity intolerance will decrease Note:  Bedrest   Problem: Nutrition: Goal: Adequate nutrition will be maintained Note:  tolerating   Problem: Skin Integrity: Goal: Risk for impaired skin integrity will decrease Note:  Puncture at Right femoral site

## 2017-08-16 NOTE — H&P (Signed)
Luis PlyJerry Bonillas has presented today for surgery, with the diagnosis of atrial flutter.  The various methods of treatment have been discussed with the patient and family. After consideration of risks, benefits and other options for treatment, the patient has consented to  Procedure(s): Catheter ablation as a surgical intervention .  Risks include but not limited to bleeding, tamponade, heart block, stroke, damage to surrounding organs, among others. The patient's history has been reviewed, patient examined, no change in status, stable for surgery.  I have reviewed the patient's chart and labs.  Questions were answered to the patient's satisfaction.    Berkeley Veldman Elberta Fortisamnitz, MD 08/16/2017 11:42 AM

## 2017-08-16 NOTE — Anesthesia Postprocedure Evaluation (Signed)
Anesthesia Post Note  Patient: Luis Nunez  Procedure(s) Performed: A-FLUTTER ABLATION (N/A )     Patient location during evaluation: PACU Anesthesia Type: MAC Level of consciousness: awake and alert Pain management: pain level controlled Vital Signs Assessment: post-procedure vital signs reviewed and stable Respiratory status: spontaneous breathing and respiratory function stable Cardiovascular status: stable Postop Assessment: no apparent nausea or vomiting Anesthetic complications: no    Last Vitals:  Vitals:   08/16/17 1435 08/16/17 1440  BP: (!) 146/94 (!) 145/90  Pulse: 85 82  Resp: 19 (!) 0  Temp:  36.5 C  SpO2: 100% 100%    Last Pain:  Vitals:   08/16/17 1349  TempSrc:   PainSc: 0-No pain                 Jodean Valade DANIEL

## 2017-08-16 NOTE — Discharge Summary (Addendum)
Discharge Summary    Patient ID: Luis Nunez,  MRN: [id number], DOB/AGE: 1956-02-12 62 y.o.  Admit date: 08/16/2017 Discharge date: 08/16/2017  Primary Care Provider: Louie Boston. Primary Cardiologist: Dr. Purvis Sheffield Electrophysiologist: Dr. Elberta Fortis  Discharge Diagnoses    Active Problems:   Typical atrial flutter (HCC)   Allergies Allergies  Allergen Reactions  . Penicillins Rash and Other (See Comments)    Has patient had a PCN reaction causing immediate rash, facial/tongue/throat swelling, SOB or lightheadedness with hypotension: yes Has patient had a PCN reaction causing severe rash involving mucus membranes or skin necrosis: No Has patient had a PCN reaction that required hospitalization No Has patient had a PCN reaction occurring within the last 10 years: No If all of the above answers are "NO", then may proceed with Cephalosporin use.    Diagnostic Studies/Procedures     PREPROCEDURE DIAGNOSIS:  Atrial flutter.      POSTPROCEDURE DIAGNOSIS:  Isthmus-dependent counter clockwise right atrial flutter.      PROCEDURES:   1. Comprehensive EP study.   2. Coronary sinus pacing and recording.   3. Mapping of supraventricular tachycardia.   4. Radiofrequency ablation of supraventricular tachycardia.      INTRODUCTION: Luis Nunez is a 62 y.o. male with a history of typical appearing atrial flutter who presents today for EP study and radiofrequency ablation.  The patient recently developed symptoms of weakness and fatigue for which he  was found to have atrial flutter with elevated ventricular rates.  The patient remains symptomatic despite rate control.  The patient has been adequately anticoagulated for three weeks and now presents for EP study and radiofrequency ablation of atrial   flutter.      DESCRIPTION OF PROCEDURE:  Informed written consent was obtained and the   patient was brought to the Electrophysiology Lab in the fasting state. The patient was  very clear that he had been compliant with eliquis over the past 3 weeks without interruption.  The patient was adequately sedated with intravenous medication as outlined in the anesthesia report.  The patient's right groin was prepped and draped in the usual sterile fashion by the EP Lab staff.  Using a percutaneous Seldinger technique, two 6-French and one 8-French hemostasis sheaths were placed   into the right common femoral vein.  A 6-French decapolar Polaris X catheter was introduced through the right common femoral vein and advanced into the coronary sinus for recording and pacing from this location.  A 6-French quadripolar Josephson catheter was introduced through the right common femoral vein and advanced into the right   ventricle for recording and pacing.  This catheter was then pulled back to the His bundle location.    Presenting Measurements: The patient presented to the Electrophysiology Lab in atrial flutter.  The surface electrocardiogram was consistent with typical atrial flutter.  The atrial flutter cycle length was 263 milliseconds.  The coronary sinus catheter activation revealed proximal to distal activation and was therefore suggestive of right atrial flutter.  The patient's QRS duration was 162 milliseconds with a QT interval of 384 milliseconds and an HV interval of 1014 milliseconds.   Entrainment and Mapping: Entrainment was performed from the left atrium, which revealed a long postpacing interval.  A Nature conservation officer II XB 8-mm ablation catheter was introduced through the right common femoral vein and advanced into the right atrium.  The catheter was positioned along the cavotricuspid isthmus.  Entrainment mapping was performed from the cavotricuspid isthmus.  The  postpacing interval was equal to the tachycardia cycle length when pacing in this location during entrainment.  The patient was therefore felt to have isthmus-dependent right atrial flutter.  Mapping was  performed along the atrial side of the cavotricuspid isthmus.  This demonstrated a moderate-sized isthmus.  I therefore elected to perform cavotricuspid isthmus ablation today.   Ablation: The ablation catheter was therefore positioned along the cavotricuspid isthmus and a series of radiofrequency applications were delivered with a target temperature of 60 degrees of 50 watts for 120 seconds each.  The tachycardia slowed and then terminated during radiofrequency   application.  Additional mapping of the atrial signal was performed with  additional ablation performed.  A 7-French Biosense Webster dual decapolar halo catheter was introduced through the right common femoral vein and advanced into the right atrium.  This catheter was positioned around the tricuspid valve annulus.  This demonstrated that the patient continued to have conduction through the cavotricuspid isthmus.  An 8-   [place] RAMP sheath was therefore advanced through the right common   femoral vein into the right atrium.  The ablation catheter was positioned through the RAMP sheath along the cavotricuspid isthmus and an additional radiofrequency applications were delivered with a target temperature of 60 degrees at 50 watts.  Following bonus radiofrequency applications, complete bidirectional isthmus block was   achieved as evident by differential atrial pacing from the low lateral right atrium.  The patient was observed for 20 minutes without return of conduction through the cavotricuspid isthmus.    Measurements following ablation: Following ablation, the stimulus to earliest atrial activation recorded across the isthmus line measured 150 milliseconds.  The AH interval measured 96 milliseconds with an HV interval of 51 milliseconds.  Atrial pacing was performed, which revealed decremental AV conduction with no evidence of PR greater than RR.  The AV Wenckebach cycle length was 560 milliseconds.  Atrial pacing was continued down to a  cycle length of 300 milliseconds with no arrhythmias induced.  Ventricular pacing was performed, which revealed midline decremental VA conduction with a retrograde VA block of 290 milliseconds.  No arrhythmias were induced. The procedure was therefore considered completed.  All catheters were removed and the sheaths were aspirated and flushed.  The sheaths were removed and hemostasis was assured. EBL<6410ml.  There were no early apparent complications.      CONCLUSIONS:   1. Isthmus-dependent right atrial flutter upon presentation.   2. Successful radiofrequency ablation of atrial flutter along the cavotricuspid isthmus with complete bidirectional isthmus block achieved.   3. No inducible arrhythmias following ablation.   4. No early apparent complications.  _____________   History of Present Illness     Luis Nunez is a 62 year old male with a history of hypertension and atrial flutter.  He has been on metoprolol and Coumadin.  He had a normal cardiac catheterization at age 62.  He was evaluated by Dr. Elberta Fortisamnitz and atrial flutter ablation was recommended.  He came to the hospital for the procedure on 08/16/2017.  Hospital Course     Consultants: None  Luis Nunez had an atrial fibrillation ablation and tolerated it well.  Initially, it was plan to keep him overnight because he did not have anyone to stay with him.  However, his daughter lives nearby and he will stay with her overnight.  Postprocedure, he was ambulating without chest pain or shortness of breath.  He was maintaining sinus rhythm.  No further inpatient work-up is indicated and he is considered stable  for discharge, to follow-up as an outpatient.  _____________  Discharge Vitals Blood pressure (!) 124/95, pulse 91, temperature (!) 97.5 F (36.4 C), temperature source Oral, resp. rate 16, height 5\' 10"  (1.778 m), weight 215 lb (97.5 kg), SpO2 100 %.  Filed Weights   08/16/17 1004  Weight: 215 lb (97.5 kg)    Labs &  Radiologic Studies    None  Disposition   Pt is being discharged home today in good condition.  Follow-up Plans & Appointments    Follow-up Information    [facility] Follow up on 09/12/2017.   Specialty:  Cardiology Why:  10:30AM, coumadin clinic/lab Contact information: [address] [place] [address] [phone]       Regan Lemming, MD Follow up on 09/25/2017.   Specialty:  Cardiology Why:  9:45AM Contact information: [address] [place] [address] [phone]            Discharge Medications   Allergies as of 08/16/2017      Reactions   Penicillins Rash, Other (See Comments)   Has patient had a PCN reaction causing immediate rash, facial/tongue/throat swelling, SOB or lightheadedness with hypotension: yes Has patient had a PCN reaction causing severe rash involving mucus membranes or skin necrosis: No Has patient had a PCN reaction that required hospitalization No Has patient had a PCN reaction occurring within the last 10 years: No If all of the above answers are "NO", then may proceed with Cephalosporin use.      Medication List    STOP taking these medications   BC HEADACHE POWDER PO     TAKE these medications   apixaban 5 MG Tabs tablet Commonly known as:  ELIQUIS Take 1 tablet (5 mg total) by mouth 2 (two) times daily.   carvedilol 25 MG tablet Commonly known as:  COREG Take 1 tablet (25 mg total) by mouth 2 (two) times daily. What changed:  how much to take   ferrous sulfate 325 (65 FE) MG tablet Commonly known as:  FERROUSUL Take 1 tablet (325 mg total) by mouth 3 (three) times daily with meals.   lisinopril-hydrochlorothiazide 20-12.5 MG tablet Commonly known as:  PRINZIDE,ZESTORETIC Take 2 tablets by mouth daily.        Outstanding Labs/Studies   None  Duration of Discharge Encounter   Greater than 30 minutes including physician  time.  Bobbye Riggs Barrett NP 08/16/2017, 7:33 PM  I have seen and examined this patient with Theodore Demark.  Agree with above, note added to reflect my findings.  On exam, RRR, no murmurs, lungs clear. Presented to the hospital for atrial flutter ablation. EP study confirmed typical atrial flutter. Now in sinus rhythm. Plan for discharge with follow up in EP clinic.    Will M. Camnitz MD 08/19/2017 7:31 AM

## 2017-08-16 NOTE — Anesthesia Preprocedure Evaluation (Addendum)
Anesthesia Evaluation  Patient identified by MRN, date of birth, ID band Patient awake    Reviewed: Allergy & Precautions, NPO status , Patient's Chart, lab work & pertinent test results, reviewed documented beta blocker date and time   Airway Mallampati: III  TM Distance: >3 FB Neck ROM: Full    Dental no notable dental hx. (+) Dental Advisory Given   Pulmonary neg pulmonary ROS,    Pulmonary exam normal breath sounds clear to auscultation       Cardiovascular hypertension, Pt. on medications and Pt. on home beta blockers Normal cardiovascular exam+ dysrhythmias Atrial Fibrillation  Rhythm:Regular Rate:Normal  ECG: A-fib, RBBB, rate 72 ECHO:  Left ventricle: The cavity size was normal. Wall thickness was increased in a pattern of moderate LVH. Systolic function was normal. The estimated ejection fraction was in the range of 60% to 65%. Wall motion was normal; there were no regional wall motion abnormalities. The study is not technically sufficient to allow evaluation of LV diastolic function. Aortic valve: Trileaflet; mildly thickened leaflets. There was mild regurgitation. Mitral valve: There was mild regurgitation. Left atrium: The atrium was moderately dilated. Right ventricle: Systolic function was mildly reduced. Tricuspid valve: There was mild regurgitation.    Neuro/Psych negative neurological ROS  negative psych ROS   GI/Hepatic negative GI ROS, Neg liver ROS,   Endo/Other  negative endocrine ROS  Renal/GU negative Renal ROS     Musculoskeletal  (+) Arthritis , Osteoarthritis,    Abdominal (+) + obese,   Peds  Hematology negative hematology ROS (+)   Anesthesia Other Findings   Reproductive/Obstetrics                            Anesthesia Physical  Anesthesia Plan  ASA: III  Anesthesia Plan: MAC   Post-op Pain Management:    Induction:   PONV Risk Score and Plan: 2 and  Ondansetron and Propofol infusion  Airway Management Planned: Natural Airway  Additional Equipment:   Intra-op Plan:   Post-operative Plan:   Informed Consent: I have reviewed the patients History and Physical, chart, labs and discussed the procedure including the risks, benefits and alternatives for the proposed anesthesia with the patient or authorized representative who has indicated his/her understanding and acceptance.   Dental advisory given  Plan Discussed with: CRNA, Anesthesiologist and Surgeon  Anesthesia Plan Comments:        Anesthesia Quick Evaluation

## 2017-08-16 NOTE — Progress Notes (Signed)
Patient was planned for same day discharge though did not have anyone available to stay with him tonight post procedure.   Anticipate discharge tomorrow.  Francis Dowseenee Shirlena Brinegar, PA-C

## 2017-08-16 NOTE — Anesthesia Procedure Notes (Signed)
Procedure Name: MAC Date/Time: 08/16/2017 12:17 PM Performed by: Duane Boston, MD Pre-anesthesia Checklist: Patient identified, Emergency Drugs available, Suction available and Patient being monitored Patient Re-evaluated:Patient Re-evaluated prior to induction Oxygen Delivery Method: Simple face mask Preoxygenation: Pre-oxygenation with 100% oxygen Induction Type: IV induction Placement Confirmation: positive ETCO2,  CO2 detector and breath sounds checked- equal and bilateral Dental Injury: Teeth and Oropharynx as per pre-operative assessment

## 2017-08-16 NOTE — Progress Notes (Signed)
Pt took tele off and and dressed wants to leave where Dr Discharges or not.

## 2017-08-16 NOTE — Discharge Instructions (Signed)
Post procedure care instructions °No driving for 4 days. No lifting over 5 lbs for 1 week. No vigorous or sexual activity for 1 week. You may return to work on in one week. Keep procedure site clean & dry. If you notice increased pain, swelling, bleeding or pus, call/return!  You may shower, but no soaking baths/hot tubs/pools for 1 week.  ° ° °

## 2017-08-19 ENCOUNTER — Encounter (HOSPITAL_COMMUNITY): Payer: Self-pay | Admitting: Cardiology

## 2017-08-19 MED FILL — Heparin Sod (Porcine)-NaCl IV Soln 1000 Unit/500ML-0.9%: INTRAVENOUS | Qty: 1000 | Status: AC

## 2017-08-23 NOTE — Telephone Encounter (Signed)
Disregard. Opened in error

## 2017-08-28 ENCOUNTER — Ambulatory Visit: Payer: Medicare Other | Admitting: Cardiology

## 2017-09-25 ENCOUNTER — Ambulatory Visit (INDEPENDENT_AMBULATORY_CARE_PROVIDER_SITE_OTHER): Payer: Medicare Other | Admitting: Cardiology

## 2017-09-25 ENCOUNTER — Other Ambulatory Visit: Payer: Self-pay

## 2017-09-25 ENCOUNTER — Encounter: Payer: Self-pay | Admitting: Cardiology

## 2017-09-25 VITALS — BP 130/88 | HR 53 | Ht 70.0 in | Wt 217.8 lb

## 2017-09-25 DIAGNOSIS — I10 Essential (primary) hypertension: Secondary | ICD-10-CM | POA: Diagnosis not present

## 2017-09-25 DIAGNOSIS — I483 Typical atrial flutter: Secondary | ICD-10-CM

## 2017-09-25 NOTE — Progress Notes (Signed)
Electrophysiology Office Note   Date:  09/25/2017   ID:  Luis Nunez, DOB 1955-06-22, MRN [id number]  PCP:  Louie Boston., MD  Cardiologist:  Purvis Sheffield Primary Electrophysiologist:  Regan Lemming, MD    Chief Complaint  Patient presents with  . Atrial Flutter     History of Present Illness: Luis Nunez is a 62 y.o. male who is being seen today for the evaluation of atrial flutter at the request of Prentice Docker. Presenting today for electrophysiology evaluation.  He has a past history of hypertension.  He was hospitalized in December due to atrial flutter.  He was started on metoprolol and Coumadin.  He was diagnosed with hypertension at the age of 43 when he entered the [employer].  He has had coronary angiography in the past at age 73 and was told that this was normal. Had atrial flutter ablation on 08/16/17.  Today, denies symptoms of palpitations, chest pain, shortness of breath, orthopnea, PND, lower extremity edema, claudication, dizziness, presyncope, syncope, bleeding, or neurologic sequela. The patient is tolerating medications without difficulties.  He is doing well since his ablation.  He is noted no issues since that time.  He has remained on his anticoagulation.  Past Medical History:  Diagnosis Date  . Dysrhythmia 02/2017   A flutter  . Hypertension    Past Surgical History:  Procedure Laterality Date  . A-FLUTTER ABLATION N/A 08/16/2017   Procedure: A-FLUTTER ABLATION;  Surgeon: Regan Lemming, MD;  Location: [facility];  Service: Cardiovascular;  Laterality: N/A;  . ATRIAL FLUTTER ABLATION  08/16/2017  . JOINT REPLACEMENT    . 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 TOTAL HIP ARTHROPLASTY Right 06/25/2017   Procedure: RIGHT TOTAL HIP ARTHROPLASTY ANTERIOR APPROACH;  Surgeon: Durene Romans, MD;  Location: [facility];  Service: Orthopedics;  Laterality: Right;  70 mins     Current Outpatient Medications  Medication Sig Dispense Refill  . apixaban (ELIQUIS) 5 MG TABS tablet Take 1 tablet (5 mg total) by mouth 2 (two) times daily. 14 tablet 0  . carvedilol (COREG) 25 MG tablet Take 50 mg by mouth 2 (two) times daily with a meal.    . ferrous sulfate (FERROUSUL) 325 (65 FE) MG tablet Take 1 tablet (325 mg total) by mouth 3 (three) times daily with meals.  3  . lisinopril-hydrochlorothiazide (PRINZIDE,ZESTORETIC) 20-12.5 MG tablet Take 2 tablets by mouth daily.      No current facility-administered medications for this visit.     Allergies:   Penicillins   Social History:  The patient  reports that he has never smoked. His smokeless tobacco use includes snuff. He reports that he drinks alcohol. He reports that he has current or past drug history. Drugs: Cocaine and Marijuana.   Family History:  The patient's family history includes Bone cancer in his brother; Breast cancer in his sister; Colon cancer in his brother; High blood pressure in his sister.   ROS:  Please see the history of present illness.   Otherwise, review of systems is positive for none.   All other systems are reviewed and negative.   PHYSICAL EXAM: VS:  Ht 5\' 10"  (1.778 m)   Wt 217 lb 12.8 oz (98.8 kg)   BMI 31.25 kg/m  , BMI Body mass index is 31.25 kg/m. GEN: Well  nourished, well developed, in no acute distress  HEENT: normal  Neck: no JVD, carotid bruits, or masses Cardiac: RRR; no murmurs, rubs, or gallops,no edema  Respiratory:  clear to auscultation bilaterally, normal work of breathing GI: soft, nontender, nondistended, + BS MS: no deformity or atrophy  Skin: warm and dry Neuro:  Strength and sensation are intact Psych: euthymic mood, full affect  EKG:  EKG is ordered today. Personal review of the ekg ordered shows sinus rhythm, first-degree AV block, right bundle branch block, left  anterior fascicular block, LVH  Recent Labs: 07/24/2017: BUN 25; Creatinine, Ser 1.69; Hemoglobin 11.2; Platelets 331; Potassium 3.7; Sodium 142    Lipid Panel  No results found for: CHOL, TRIG, HDL, CHOLHDL, VLDL, LDLCALC, LDLDIRECT   Wt Readings from Last 3 Encounters:  09/25/17 217 lb 12.8 oz (98.8 kg)  08/16/17 215 lb (97.5 kg)  07/24/17 216 lb 6.4 oz (98.2 kg)      Other studies Reviewed: Additional studies/ records that were reviewed today include: TTE 02/20/17  Review of the above records today demonstrates:  - Left ventricle: The cavity size was normal. Wall thickness was   increased in a pattern of moderate LVH. Systolic function was   normal. The estimated ejection fraction was in the range of 60%   to 65%. Wall motion was normal; there were no regional wall   motion abnormalities. The study is not technically sufficient to   allow evaluation of LV diastolic function. - Aortic valve: Trileaflet; mildly thickened leaflets. There was   mild regurgitation. - Mitral valve: There was mild regurgitation. - Left atrium: The atrium was moderately dilated. - Right ventricle: Systolic function was mildly reduced. - Tricuspid valve: There was mild regurgitation.   ASSESSMENT AND PLAN:  1.  Atrial flutter: On Eliquis. S/p ablation 08/16/17.  Not had any recurrences.  We Dirck Butch stop his Eliquis today.  He Kalliopi Coupland continue follow-up with his primary cardiologist.  2.  Hypertension: Blood pressure well controlled today.  No changes.    Current medicines are reviewed at length with the patient today.   The patient does not have concerns regarding his medicines.  The following changes were made today: Stop Eliquis  Labs/ tests ordered today include:  No orders of the defined types were placed in this encounter.  Case discussed with primary cardiology  Disposition:   FU with Sarah-Jane Nazario as needed months  Signed, Marcel Gary Jorja LoaMartin Davit Vassar, MD  09/25/2017 10:34 AM     [facility] [address] [place] [address] [phone] (office) [phone] (fax)

## 2017-09-25 NOTE — Patient Instructions (Signed)
Medication Instructions:  Your physician has recommended you make the following change in your medication:  1. STOP Eliquis  * If you need a refill on your cardiac medications before your next appointment, please call your pharmacy.   Labwork: None ordered  Testing/Procedures: None ordered  Follow-Up: No follow up is needed at this time with Dr. Elberta Fortisamnitz.  He will see you on an as needed basis.  *Please note that any paperwork needing to be filled out by the provider will need to be addressed at the front desk prior to seeing the provider. Please note that any FMLA, disability or other documents regarding health condition is subject to a $25.00 charge that must be received prior to completion of paperwork in the form of a money order or check.  Thank you for choosing [facility]!!   Dory HornSherri Marien Manship, RN [phone]

## 2017-10-28 ENCOUNTER — Other Ambulatory Visit: Payer: Self-pay | Admitting: Cardiovascular Disease

## 2017-10-28 MED ORDER — CARVEDILOL 25 MG PO TABS: 50.0000 mg | ORAL_TABLET | Freq: Two times a day (BID) | ORAL | 3 refills | Status: DC

## 2017-10-28 NOTE — Telephone Encounter (Signed)
Done

## 2017-10-28 NOTE — Telephone Encounter (Signed)
°*  STAT* If patient is at the pharmacy, call can be transferred to refill team.   1. Which medications need to be refilled?carvedilol (COREG) 25 MG tablet    2. Which pharmacy/location (including street and city if local pharmacy) is medication to be sent to?  [facility]  3. Do they need a 30 day or 90 day supply?

## 2017-11-07 ENCOUNTER — Ambulatory Visit (INDEPENDENT_AMBULATORY_CARE_PROVIDER_SITE_OTHER): Payer: Medicare Other | Admitting: *Deleted

## 2017-11-07 NOTE — Progress Notes (Signed)
Pt here today for Eliquis follow up but DrCamnitz stopped Eliquis in July after pt had a flutter ablation in June.  No Charge for todays visit.

## 2018-02-14 ENCOUNTER — Other Ambulatory Visit: Payer: Self-pay | Admitting: Cardiology

## 2019-02-25 ENCOUNTER — Telehealth (HOSPITAL_COMMUNITY): Payer: Self-pay | Admitting: Licensed Clinical Social Worker

## 2019-03-02 ENCOUNTER — Telehealth (HOSPITAL_COMMUNITY): Payer: Self-pay | Admitting: Licensed Clinical Social Worker

## 2019-03-02 NOTE — Telephone Encounter (Signed)
Return contact pending for assessment for CDIOP

## 2019-03-02 NOTE — Telephone Encounter (Signed)
Return contact pending for assessment for CDIOP 

## 2019-04-13 ENCOUNTER — Other Ambulatory Visit: Payer: Self-pay

## 2019-04-13 MED ORDER — CARVEDILOL 25 MG PO TABS: 50.0000 mg | ORAL_TABLET | Freq: Two times a day (BID) | ORAL | 0 refills | Status: DC

## 2019-04-30 ENCOUNTER — Other Ambulatory Visit: Payer: Self-pay

## 2019-04-30 DIAGNOSIS — Z20822 Contact with and (suspected) exposure to covid-19: Secondary | ICD-10-CM

## 2019-05-01 LAB — NOVEL CORONAVIRUS, NAA: SARS-CoV-2, NAA: NOT DETECTED

## 2019-05-06 ENCOUNTER — Other Ambulatory Visit: Payer: Self-pay | Admitting: Cardiology

## 2019-07-28 ENCOUNTER — Other Ambulatory Visit: Payer: Self-pay | Admitting: Cardiology

## 2019-09-30 ENCOUNTER — Other Ambulatory Visit: Payer: Self-pay

## 2019-09-30 MED ORDER — LISINOPRIL-HYDROCHLOROTHIAZIDE 20-12.5 MG PO TABS: 2.0000 | ORAL_TABLET | Freq: Every day | ORAL | 0 refills | Status: DC

## 2019-09-30 NOTE — Telephone Encounter (Signed)
This is a [facility] pt °

## 2019-10-08 ENCOUNTER — Other Ambulatory Visit: Payer: Self-pay | Admitting: Family Medicine

## 2019-10-08 NOTE — Progress Notes (Signed)
Cardiology Office Note  Date: 10/09/2019   ID: Luis Nunez, DOB January 18, 1956, MRN [id number]  PCP:  Louie Boston., MD  Cardiologist:  No primary care provider on file. Electrophysiologist:  None   Chief Complaint: Follow-up after core atrial flutter.  History of Present Illness: Luis Nunez is a 64 y.o. male with a history of atrial flutter (atrial flutter ablation 08/16/2017), HTN.  Last seen by Dr. Elberta Fortis  EP on 09/25/2017 for evaluation of atrial flutter at the request of Dr. Prentice Docker.  He had been hospitalized in December 2018 due to atrial flutter.  He had been previously started on metoprolol and Coumadin.  Long history of hypertension back to age 30.  Cardiac catheterization at age 67 which was normal.  His Eliquis was stopped during that visit.  His blood pressure was well controlled and there were no changes.  He was to follow-up with primary cardiologist.  He is here for follow-up and refills.  He denies any recent acute illnesses or hospitalizations and interim.  He denies any anginal or exertional symptoms, palpitations or arrhythmias, orthostatic symptoms, CVA or TIA-like symptoms, PND, orthopnea, bleeding issues.  Denies any claudication-like symptoms, DVT or PE-like symptoms.  Blood pressure is on the low side but he denies any symptoms at all.  EKG showed sinus bradycardia with first-degree AV block, left axis deviation, right bundle branch block.  Criteria for LVH.  Heart rate of 55  Past Medical History:  Diagnosis Date   Dysrhythmia 02/2017   A flutter   Hypertension     Past Surgical History:  Procedure Laterality Date   A-FLUTTER ABLATION N/A 08/16/2017   Procedure: A-FLUTTER ABLATION;  Surgeon: Regan Lemming, MD;  Location: [facility];  Service: Cardiovascular;  Laterality: N/A;   ATRIAL FLUTTER ABLATION  08/16/2017   JOINT REPLACEMENT     TOTAL HIP ARTHROPLASTY Left 03/19/2017   Procedure: LEFT TOTAL HIP ARTHROPLASTY ANTERIOR  APPROACH;  Surgeon: Durene Romans, MD;  Location: [facility];  Service: Orthopedics;  Laterality: Left;  70 mins   TOTAL HIP ARTHROPLASTY Right 06/25/2017   Procedure: RIGHT TOTAL HIP ARTHROPLASTY ANTERIOR APPROACH;  Surgeon: Durene Romans, MD;  Location: [facility];  Service: Orthopedics;  Laterality: Right;  70 mins    Current Outpatient Medications  Medication Sig Dispense Refill   carvedilol (COREG) 25 MG tablet Take 2 tablets (50 mg total) by mouth 2 (two) times daily with a meal. 360 tablet 3   ferrous sulfate (FERROUSUL) 325 (65 FE) MG tablet Take 1 tablet (325 mg total) by mouth 3 (three) times daily with meals.  3   lisinopril-hydrochlorothiazide (ZESTORETIC) 20-12.5 MG tablet Take 2 tablets by mouth daily. 180 tablet 3   No current facility-administered medications for this visit.   Allergies:  Penicillins   Social History: The patient  reports that he has never smoked. His smokeless tobacco use includes snuff. He reports current alcohol use. He reports current drug use. Drugs: Cocaine and Marijuana.   Family History: The patient's family history includes Bone cancer in his brother; Breast cancer in his sister; Colon cancer in his brother; High blood pressure in his sister.   ROS:  Please see the history of present illness. Otherwise, complete review of systems is positive for none.  All other systems are reviewed and negative.   Physical Exam: VS:  BP 100/88    Pulse (!) 55    Ht 5\' 9"  (1.753 m)    Wt 230 lb (104.3 kg)  SpO2 96%    BMI 33.97 kg/m , BMI Body mass index is 33.97 kg/m.  Wt Readings from Last 3 Encounters:  10/09/19 230 lb (104.3 kg)  09/25/17 217 lb 12.8 oz (98.8 kg)  08/16/17 215 lb (97.5 kg)    General: Patient appears comfortable at rest. Neck: Supple, no elevated JVP or carotid bruits, no thyromegaly. Lungs: Clear to auscultation, nonlabored breathing at rest. Cardiac: Bradycardic regular rate and rhythm, no S3 or significant systolic murmur, no pericardial  rub. Extremities: No pitting edema, distal pulses 2+. Skin: Warm and dry. Musculoskeletal: No kyphosis. Neuropsychiatric: Alert and oriented x3, affect grossly appropriate.  ECG:  An ECG dated 10/09/2019 was personally reviewed today and demonstrated:  Sinus bradycardia with first-degree AV block, left axis deviation, RBBB rate of 55  Recent Labwork: No results found for requested labs within last 8760 hours.  No results found for: CHOL, TRIG, HDL, CHOLHDL, VLDL, LDLCALC, LDLDIRECT  Other Studies Reviewed Today:  Atrial flutter ablation/EP study 08/16/2017 CONCLUSIONS:   1. Isthmus-dependent right atrial flutter upon presentation.   2. Successful radiofrequency ablation of atrial flutter along the cavotricuspid isthmus with complete bidirectional isthmus block achieved.   3. No inducible arrhythmias following ablation.   4. No early apparent complications.      TTE 02/20/17  Review of the above records today demonstrates:  - Left ventricle: The cavity size was normal. Wall thickness was increased in a pattern of moderate LVH. Systolic function was normal. The estimated ejection fraction was in the range of 60% to 65%. Wall motion was normal; there were no regional wall motion abnormalities. The study is not technically sufficient to allow evaluation of LV diastolic function. - Aortic valve: Trileaflet; mildly thickened leaflets. There was mild regurgitation. - Mitral valve: There was mild regurgitation. - Left atrium: The atrium was moderately dilated. - Right ventricle: Systolic function was mildly reduced. - Tricuspid valve: There was mild regurgitation  Assessment and Plan:  1. Atrial flutter, unspecified type (HCC)   2. Essential hypertension    1. Atrial flutter, unspecified type (HCC) EKG shows sinus bradycardia with a rate of 55 first-degree AV block, left axis deviation, right bundle branch block, voltage criteria for LVH.  Continue carvedilol 25 mg p.o.  twice daily.  Denies any palpitations or arrhythmias.  He is status post ablation for atrial flutter.  2. Essential hypertension Blood pressure on the low side today 100/88.  Patient denies any symptoms.  Continue lisinopril hydrochlorothiazide 20/12.5 mg p.o. daily.  Medication Adjustments/Labs and Tests Ordered: Current medicines are reviewed at length with the patient today.  Concerns regarding medicines are outlined above.   Disposition: Follow-up with Dr. Wyline Mood or APP 1 year  Signed, Rennis Harding, NP 10/09/2019 10:11 AM    [facility] [address] [place], [place], [place] [address] Phone: [phone]; Fax: [phone]

## 2019-10-09 ENCOUNTER — Encounter: Payer: Self-pay | Admitting: Family Medicine

## 2019-10-09 ENCOUNTER — Ambulatory Visit: Payer: Medicare Other | Admitting: Family Medicine

## 2019-10-09 VITALS — BP 100/88 | HR 55 | Ht 69.0 in | Wt 230.0 lb

## 2019-10-09 DIAGNOSIS — I10 Essential (primary) hypertension: Secondary | ICD-10-CM | POA: Diagnosis not present

## 2019-10-09 DIAGNOSIS — I4892 Unspecified atrial flutter: Secondary | ICD-10-CM | POA: Diagnosis not present

## 2019-10-09 MED ORDER — LISINOPRIL-HYDROCHLOROTHIAZIDE 20-12.5 MG PO TABS: 2.0000 | ORAL_TABLET | Freq: Every day | ORAL | 3 refills | Status: DC

## 2019-10-09 MED ORDER — CARVEDILOL 25 MG PO TABS: 25.0000 mg | ORAL_TABLET | Freq: Two times a day (BID) | ORAL | 3 refills | Status: DC

## 2019-10-09 MED ORDER — CARVEDILOL 25 MG PO TABS: 50.0000 mg | ORAL_TABLET | Freq: Two times a day (BID) | ORAL | 3 refills | Status: DC

## 2019-10-09 NOTE — Patient Instructions (Addendum)

## 2019-10-12 ENCOUNTER — Other Ambulatory Visit: Payer: Self-pay | Admitting: Cardiology

## 2019-11-02 IMAGING — DX DG PORTABLE PELVIS
1 series · 1 of 1 positions shown · non-contrast
Comparison: Intraoperative C-arm views.  03/19/2017 pelvis film.

CLINICAL DATA: 62-year-old male post total right hip replacement
today. Subsequent encounter.

EXAM:
PORTABLE PELVIS 1-2 VIEWS

[pelvis ap]
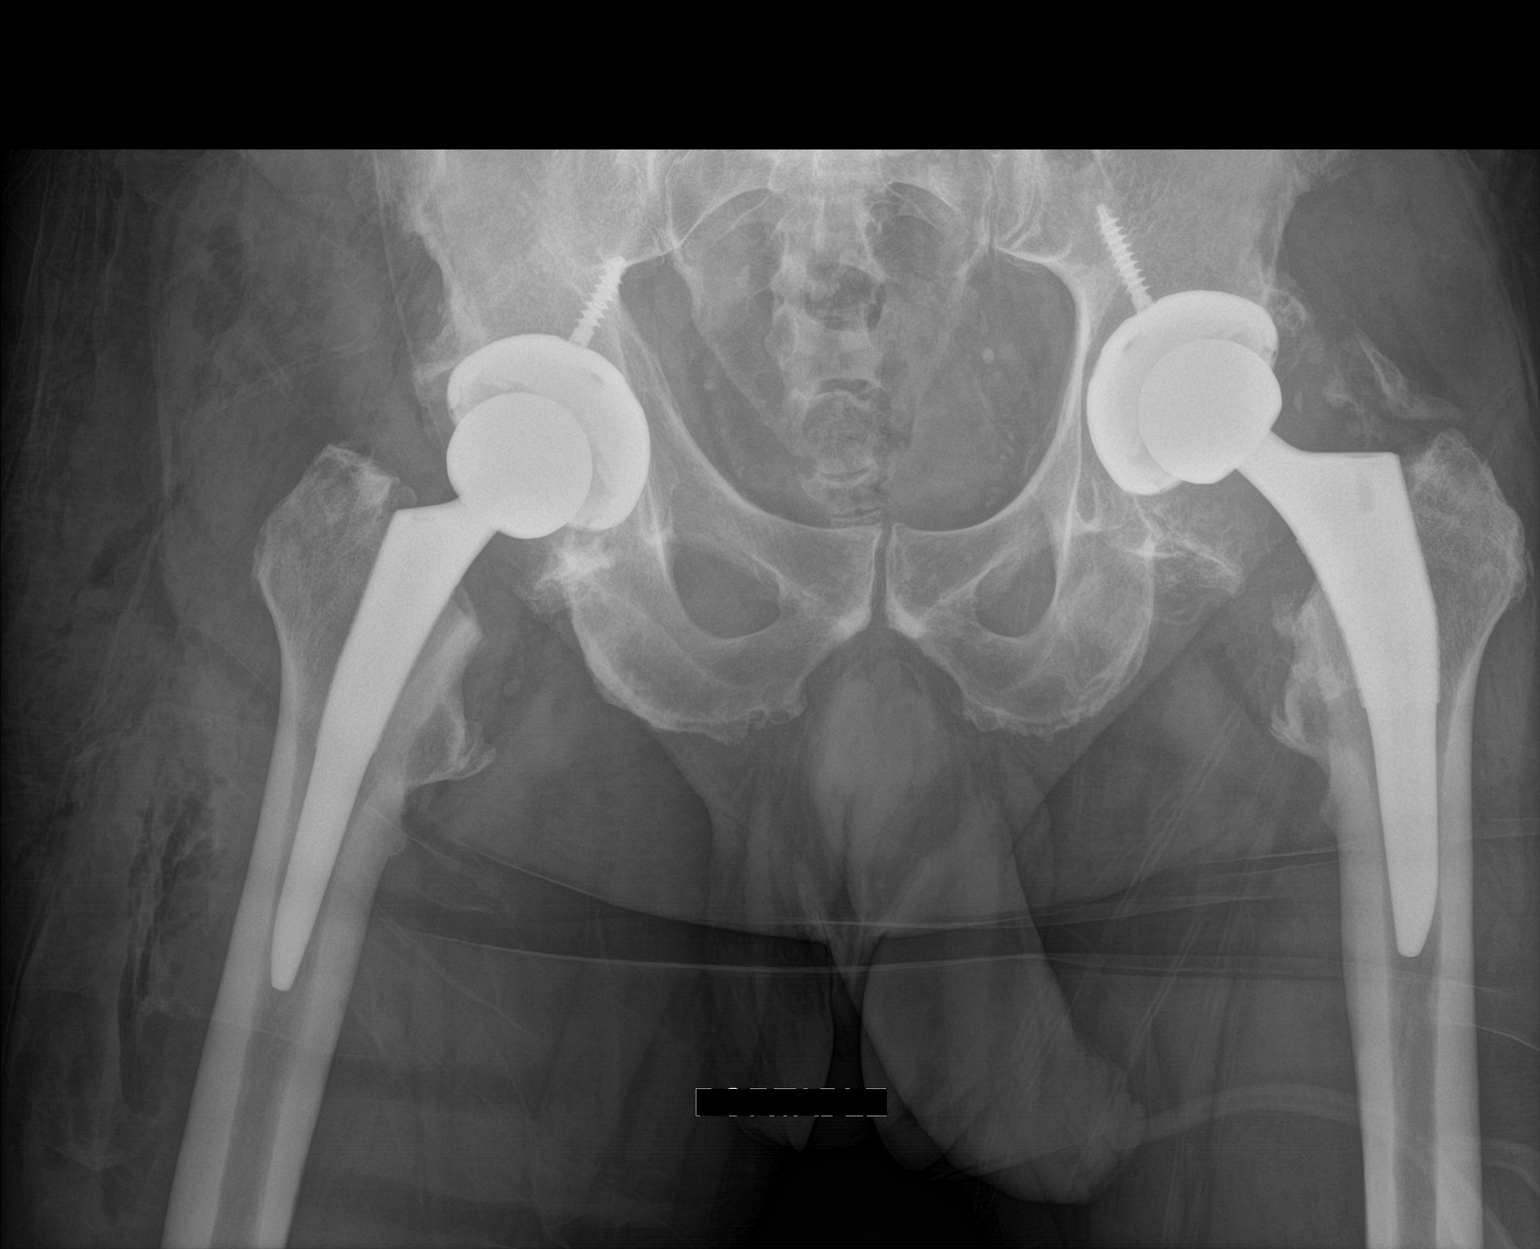

[1 of 1 positions shown; findings below may reference images not displayed]

FINDINGS: Post recent total right hip replacement. Right acetabular screw
breaches the medial cortex. No fracture seen along the femoral
component on frontal projection.

Post remote left hip replacement.
IMPRESSION: Post recent total right hip replacement. Right acetabular screw
breaches the medial cortex.

These results will be called to the ordering clinician or
representative by the Radiologist Assistant, and communication
documented in the PACS or zVision Dashboard.

## 2021-03-09 ENCOUNTER — Telehealth: Payer: Self-pay | Admitting: Cardiology

## 2021-03-09 NOTE — Telephone Encounter (Signed)
Pt moved to [place] and has been having problems with his BP since moving back here in July of 2022.  He's had medication changes since his last visit.   Please give pt a call @ [phone]

## 2021-03-09 NOTE — Telephone Encounter (Signed)
Says he last saw a doctor in [place] in June 2022 and has not seen another doctor since that time. Says the doctor in [place] made some changes to his medications. Say he was told by his previous PCP that he was no longer a patient and he needed to find another PCP. Checks his BP at home regularly. Reports average BP 150-165/90-110 & HR mid 50-60's. Gave sooner appointment to see Dr. Wyline Mood on 04/11/21. Advised to continue monitoring BP. Advised if he develops blurred vision, headache, sob, dizziness or chest pain, to go to the ED for an evaluation. Verbalized understanding of plan.

## 2021-04-10 NOTE — Progress Notes (Signed)
Clinical Summary [name] is a 66 y.o.male former patient of Dr Luis Nunez, this is our first visit together. Seen for the following medical problems.  1.HTN - compliant with meds - reports multiple changes while he was living in [place].    2. Afluttter - history of aflutter ablation - no recent palpitations - no significant lightheadedness or dizziness    Past Medical History:  Diagnosis Date   Dysrhythmia 02/2017   A flutter   Hypertension      Allergies  Allergen Reactions   Penicillins Rash and Other (See Comments)    Has patient had a PCN reaction causing immediate rash, facial/tongue/throat swelling, SOB or lightheadedness with hypotension: yes Has patient had a PCN reaction causing severe rash involving mucus membranes or skin necrosis: No Has patient had a PCN reaction that required hospitalization No Has patient had a PCN reaction occurring within the last 10 years: No If all of the above answers are "NO", then may proceed with Cephalosporin use.     Current Outpatient Medications  Medication Sig Dispense Refill   carvedilol (COREG) 25 MG tablet Take 1 tablet (25 mg total) by mouth 2 (two) times daily with a meal. 180 tablet 3   ferrous sulfate (FERROUSUL) 325 (65 FE) MG tablet Take 1 tablet (325 mg total) by mouth 3 (three) times daily with meals.  3   lisinopril-hydrochlorothiazide (ZESTORETIC) 20-12.5 MG tablet Take 2 tablets by mouth daily. 180 tablet 3   No current facility-administered medications for this visit.     Past Surgical History:  Procedure Laterality Date   A-FLUTTER ABLATION N/A 08/16/2017   Procedure: A-FLUTTER ABLATION;  Surgeon: Regan Lemming, MD;  Location: [facility];  Service: Cardiovascular;  Laterality: N/A;   ATRIAL FLUTTER ABLATION  08/16/2017   JOINT REPLACEMENT     TOTAL HIP ARTHROPLASTY Left 03/19/2017   Procedure: LEFT TOTAL HIP ARTHROPLASTY ANTERIOR APPROACH;  Surgeon: Durene Romans, MD;  Location:  [facility];  Service: Orthopedics;  Laterality: Left;  70 mins   TOTAL HIP ARTHROPLASTY Right 06/25/2017   Procedure: RIGHT TOTAL HIP ARTHROPLASTY ANTERIOR APPROACH;  Surgeon: Durene Romans, MD;  Location: [facility];  Service: Orthopedics;  Laterality: Right;  70 mins     Allergies  Allergen Reactions   Penicillins Rash and Other (See Comments)    Has patient had a PCN reaction causing immediate rash, facial/tongue/throat swelling, SOB or lightheadedness with hypotension: yes Has patient had a PCN reaction causing severe rash involving mucus membranes or skin necrosis: No Has patient had a PCN reaction that required hospitalization No Has patient had a PCN reaction occurring within the last 10 years: No If all of the above answers are "NO", then may proceed with Cephalosporin use.      Family History  Problem Relation Age of Onset   High blood pressure Sister    Breast cancer Sister    Colon cancer Brother    Bone cancer Brother      Social History [name] reports that he has never smoked. His smokeless tobacco use includes snuff. [name] reports current alcohol use.   Review of Systems CONSTITUTIONAL: No weight loss, fever, chills, weakness or fatigue.  HEENT: Eyes: No visual loss, blurred vision, double vision or yellow sclerae.No hearing loss, sneezing, congestion, runny nose or sore throat.  SKIN: No rash or itching.  CARDIOVASCULAR: per hpi RESPIRATORY: No shortness of breath, cough or sputum.  GASTROINTESTINAL: No anorexia, nausea, vomiting or diarrhea. No  abdominal pain or blood.  GENITOURINARY: No burning on urination, no polyuria NEUROLOGICAL: No headache, dizziness, syncope, paralysis, ataxia, numbness or tingling in the extremities. No change in bowel or bladder control.  MUSCULOSKELETAL: No muscle, back pain, joint pain or stiffness.  LYMPHATICS: No enlarged nodes. No history of splenectomy.  PSYCHIATRIC: No history of depression or anxiety.  ENDOCRINOLOGIC:  No reports of sweating, cold or heat intolerance. No polyuria or polydipsia.  Marland Kitchen   Physical Examination Today's Vitals   04/11/21 0856  BP: (!) 178/100  Pulse: (!) 46  SpO2: 98%  Weight: 238 lb 9.6 oz (108.2 kg)  Height: 5\' 10"  (1.778 m)   Body mass index is 34.24 kg/m.  Gen: resting comfortably, no acute distress HEENT: no scleral icterus, pupils equal round and reactive, no palptable cervical adenopathy,  CVL irreg, no m/r/g no jvd Resp: Clear to auscultation bilaterally GI: abdomen is soft, non-tender, non-distended, normal bowel sounds, no hepatosplenomegaly MSK: extremities are warm, no edema.  Skin: warm, no rash Neuro:  no focal deficits Psych: appropriate affect      Assessment and Plan  1.Aflutter - prior ablation - EKG today shows he is back in aflutter with HRs in 40s. No symptoms - lower coreg to 12.5mg  bid - CHADS2Vasc score is 2, start eliquis 5mg  bid - EKG in 3 weeks, if still in aflutter consider DCCV  2. HTN - elevated today - lower lisinopril to 40mg  daily, he is not taking zestoretic. - start norvasc 5mg  daily. Continue clonidine for now but may look to wean off in time for alernative agent - bp check in 3 weeks, room to titrate norvasc. F/u labs will help determine other possible agents, looks like has had some renal dysfunction historically.   Obtain 6 pack of labs.      , M.D.

## 2021-04-11 ENCOUNTER — Encounter: Payer: Self-pay | Admitting: Cardiology

## 2021-04-11 ENCOUNTER — Ambulatory Visit: Payer: Medicare PPO | Admitting: Cardiology

## 2021-04-11 ENCOUNTER — Other Ambulatory Visit: Payer: Self-pay

## 2021-04-11 VITALS — BP 178/100 | HR 46 | Ht 70.0 in | Wt 238.6 lb

## 2021-04-11 DIAGNOSIS — Z96641 Presence of right artificial hip joint: Secondary | ICD-10-CM | POA: Diagnosis not present

## 2021-04-11 DIAGNOSIS — I10 Essential (primary) hypertension: Secondary | ICD-10-CM

## 2021-04-11 DIAGNOSIS — I4892 Unspecified atrial flutter: Secondary | ICD-10-CM

## 2021-04-11 DIAGNOSIS — R7309 Other abnormal glucose: Secondary | ICD-10-CM

## 2021-04-11 MED ORDER — LISINOPRIL 40 MG PO TABS: 40.0000 mg | ORAL_TABLET | Freq: Every day | ORAL | 6 refills | Status: DC

## 2021-04-11 MED ORDER — APIXABAN 5 MG PO TABS: 5.0000 mg | ORAL_TABLET | Freq: Two times a day (BID) | ORAL | 0 refills | Status: DC

## 2021-04-11 MED ORDER — CARVEDILOL 12.5 MG PO TABS: 12.5000 mg | ORAL_TABLET | Freq: Two times a day (BID) | ORAL | 6 refills | Status: DC

## 2021-04-11 MED ORDER — APIXABAN 5 MG PO TABS: 5.0000 mg | ORAL_TABLET | Freq: Two times a day (BID) | ORAL | 6 refills | Status: DC

## 2021-04-11 MED ORDER — AMLODIPINE BESYLATE 5 MG PO TABS: 5.0000 mg | ORAL_TABLET | Freq: Every day | ORAL | 6 refills | Status: DC

## 2021-04-11 NOTE — Patient Instructions (Addendum)
Medication Instructions:  Decrease Coreg to 12.5mg  twice a day   Begin Eliquis 5mg  twice a day  Decrease Lisinopril to 40mg  daily Begin Norvasc 5mg  daily Continue all other medications.     Labwork: BMET, TSH, CBC, FLP, Mg, HgA1c - orders given today Reminder:  Nothing to eat or drink after 12 midnight prior to labs. Office will contact with results via phone or letter.     Testing/Procedures: none  Follow-Up: 2 months   Any Other Special Instructions Will Be Listed Below (If Applicable). 3 weeks - nurse visit for vitals & ekg   If you need a refill on your cardiac medications before your next appointment, please call your pharmacy.

## 2021-05-02 ENCOUNTER — Ambulatory Visit: Payer: Medicare PPO

## 2021-05-08 ENCOUNTER — Ambulatory Visit (INDEPENDENT_AMBULATORY_CARE_PROVIDER_SITE_OTHER): Payer: Medicare PPO | Admitting: *Deleted

## 2021-05-08 VITALS — BP 158/90 | HR 51 | Wt 233.6 lb

## 2021-05-08 DIAGNOSIS — I4892 Unspecified atrial flutter: Secondary | ICD-10-CM | POA: Diagnosis not present

## 2021-05-08 NOTE — Progress Notes (Signed)
Patient in office for EKG & vitals. ? ?States that he forgot to take his medications this morning, but will take as soon as he gets back home.  ? ?States he feels fine on recent medication changes.  ?

## 2021-05-14 NOTE — Progress Notes (Signed)
Remains in aflutter with lower heart rates. Can he lower coreg further to 3.125 mg bid. Has he missed any doses of his eliquis, if not would like to schedule a cardioversion to get his heart back into rhythm. Please update me on eliquis and if patient would be willing to proceed with cardioversion.  ? ?Zandra Abts MD ?

## 2021-05-15 NOTE — Progress Notes (Signed)
Voicemail not set up.

## 2021-05-18 NOTE — Progress Notes (Signed)
Attempt # 2 to reach patient - vm not set up.  ?

## 2021-05-23 LAB — BASIC METABOLIC PANEL
BUN/Creatinine Ratio: 14 (ref 10–24)
BUN: 19 mg/dL (ref 8–27)
CO2: 24 mmol/L (ref 20–29)
Calcium: 9.3 mg/dL (ref 8.6–10.2)
Chloride: 102 mmol/L (ref 96–106)
Creatinine, Ser: 1.38 mg/dL — ABNORMAL HIGH (ref 0.76–1.27)
Glucose: 98 mg/dL (ref 70–99)
Potassium: 4.8 mmol/L (ref 3.5–5.2)
Sodium: 139 mmol/L (ref 134–144)
eGFR: 56 mL/min/{1.73_m2} — ABNORMAL LOW (ref >59)

## 2021-05-23 LAB — CBC
Hematocrit: 43 % (ref 37.5–51.0)
Hemoglobin: 14.6 g/dL (ref 13.0–17.7)
MCH: 29.5 pg (ref 26.6–33.0)
MCHC: 34 g/dL (ref 31.5–35.7)
MCV: 87 fL (ref 79–97)
Platelets: 261 10*3/uL (ref 150–450)
RBC: 4.95 x10E6/uL (ref 4.14–5.80)
RDW: 13.9 % (ref 11.6–15.4)
WBC: 4.6 10*3/uL (ref 3.4–10.8)

## 2021-05-23 LAB — TSH: TSH: 2.91 u[IU]/mL (ref 0.450–4.500)

## 2021-05-23 LAB — LIPID PANEL
Chol/HDL Ratio: 5 ratio (ref 0.0–5.0)
Cholesterol, Total: 213 mg/dL — ABNORMAL HIGH (ref 100–199)
HDL: 43 mg/dL (ref >39)
LDL Chol Calc (NIH): 147 mg/dL — ABNORMAL HIGH (ref 0–99)
Triglycerides: 127 mg/dL (ref 0–149)
VLDL Cholesterol Cal: 23 mg/dL (ref 5–40)

## 2021-05-23 LAB — HEMOGLOBIN A1C
Est. average glucose Bld gHb Est-mCnc: 123 mg/dL
Hgb A1c MFr Bld: 5.9 % — ABNORMAL HIGH (ref 4.8–5.6)

## 2021-05-23 LAB — MAGNESIUM: Magnesium: 2.4 mg/dL — ABNORMAL HIGH (ref 1.6–2.3)

## 2021-05-29 ENCOUNTER — Encounter: Payer: Self-pay | Admitting: *Deleted

## 2021-05-29 NOTE — Progress Notes (Signed)
Will send message via mychart.

## 2021-05-29 NOTE — Progress Notes (Signed)
Attempt # 3 - vm not set up.  ?

## 2021-06-01 NOTE — Progress Notes (Signed)
Was able to reach friend Haywood Lasso) - states that he is probably out on the golf course, goes everyday.  She will have him return call tomorrow.  ? ? ?

## 2021-06-09 ENCOUNTER — Telehealth: Payer: Self-pay | Admitting: *Deleted

## 2021-06-09 NOTE — Telephone Encounter (Signed)
Call placed to patient - see nurse visit documentation.  ?

## 2021-06-09 NOTE — Telephone Encounter (Signed)
-----   Message from Antoine Poche, MD sent at 05/24/2021  4:19 PM EDT ----- ?Prediabetic and borderline cholesterol, needs to work on weight loss and exercise to improve both. See 05/08/21 message, did he response about a possible cardioversion ? ?Dominga Ferry MD ?

## 2021-06-09 NOTE — Progress Notes (Signed)
Was finally able to make contact with patient.  States he can never tell when hes is in or out of rhythm.  Also can not tell when his heart rate is low due to not registering properly on his monitor.  Patient states that he was never able to begin the Eliquis as prescribed at last OV.  States pharmacy told him he would have to pay $300.00 before getting the Eliquis at $37.50 / month.  He states that he is just not able to afford that amount right now.  I did discuss Warfarin as possibly being another option for him as this would be much cheaper but would have to come to office for finger pricks.  He verbalized understanding and will discuss all of this further with provider at his OV on 06/15/2021 with Dr. Wyline Mood.   ?

## 2021-06-09 NOTE — Telephone Encounter (Signed)
Laurine Blazer, LPN  ?[id number]  [id number] AM EDT Back to Top  ?  ?Notified, copy to pcp.   ? ?

## 2021-06-15 ENCOUNTER — Ambulatory Visit: Payer: Medicare PPO | Admitting: Cardiology

## 2021-06-15 ENCOUNTER — Encounter: Payer: Self-pay | Admitting: Cardiology

## 2021-06-15 VITALS — BP 150/88 | HR 46 | Ht 70.0 in | Wt 235.6 lb

## 2021-06-15 DIAGNOSIS — E782 Mixed hyperlipidemia: Secondary | ICD-10-CM | POA: Diagnosis not present

## 2021-06-15 DIAGNOSIS — I4892 Unspecified atrial flutter: Secondary | ICD-10-CM

## 2021-06-15 DIAGNOSIS — I10 Essential (primary) hypertension: Secondary | ICD-10-CM | POA: Diagnosis not present

## 2021-06-15 MED ORDER — AMLODIPINE BESYLATE 10 MG PO TABS: 10.0000 mg | ORAL_TABLET | Freq: Every day | ORAL | 6 refills | Status: DC

## 2021-06-15 MED ORDER — CARVEDILOL 6.25 MG PO TABS: 6.2500 mg | ORAL_TABLET | Freq: Two times a day (BID) | ORAL | 6 refills | Status: DC

## 2021-06-15 MED ORDER — ATORVASTATIN CALCIUM 40 MG PO TABS: 40.0000 mg | ORAL_TABLET | Freq: Every day | ORAL | 6 refills | Status: DC

## 2021-06-15 NOTE — Patient Instructions (Signed)
Medication Instructions:  ?Decrease Coreg to 6.25mg  twice a day  ?Increase Norvasc to 10mg  daily  ?Begin Atorvastatin 40mg  daily  ?Continue all other medications.    ? ?Labwork: ?none ? ?Testing/Procedures: ?none ? ?Follow-Up: ?3 months  ? ?Any Other Special Instructions Will Be Listed Below (If Applicable). ? ? ?If you need a refill on your cardiac medications before your next appointment, please call your pharmacy. ? ?

## 2021-06-15 NOTE — Progress Notes (Signed)
? ? ? ?Clinical Summary ?[name] is a 66 y.o.male ? ?1.HTN ?- compliant with meds ?- reports multiple changes while he was living in [place].  ?  ?- he is compliant with meds ? ?  ?2. Afluttter ?- history of aflutter ablation ? ?- eliquis was too expensive,ongoing talks with his insurance, has to meet deductible but states will be able to start  ?- still taking coreg 12.5mg  bid, HRs today 47 ? ?3. Hyperlipidemia/Primary prevention ?LDL 147 ?ASCVD risk 18.1% ?Past Medical History:  ?Diagnosis Date  ? Dysrhythmia 02/2017  ? A flutter  ? Hypertension   ? ? ? ?Allergies  ?Allergen Reactions  ? Penicillins Rash and Other (See Comments)  ?  Has patient had a PCN reaction causing immediate rash, facial/tongue/throat swelling, SOB or lightheadedness with hypotension: yes ?Has patient had a PCN reaction causing severe rash involving mucus membranes or skin necrosis: No ?Has patient had a PCN reaction that required hospitalization No ?Has patient had a PCN reaction occurring within the last 10 years: No ?If all of the above answers are "NO", then may proceed with Cephalosporin use.  ? ? ? ?Current Outpatient Medications  ?Medication Sig Dispense Refill  ? amLODipine (NORVASC) 5 MG tablet Take 1 tablet (5 mg total) by mouth daily. 30 tablet 6  ? apixaban (ELIQUIS) 5 MG TABS tablet Take 1 tablet (5 mg total) by mouth 2 (two) times daily. 60 tablet 6  ? carvedilol (COREG) 12.5 MG tablet Take 1 tablet (12.5 mg total) by mouth 2 (two) times daily. 60 tablet 6  ? cloNIDine (CATAPRES) 0.1 MG tablet Take 0.1 mg by mouth 2 (two) times daily.    ? lisinopril (ZESTRIL) 40 MG tablet Take 1 tablet (40 mg total) by mouth daily. 30 tablet 6  ? ?No current facility-administered medications for this visit.  ? ? ? ?Past Surgical History:  ?Procedure Laterality Date  ? A-FLUTTER ABLATION N/A 08/16/2017  ? Procedure: A-FLUTTER ABLATION;  Surgeon: Regan Lemming, MD;  [facility]: [facility];  Service: Cardiovascular;  Laterality:  N/A;  ? ATRIAL FLUTTER ABLATION  08/16/2017  ? JOINT REPLACEMENT    ? TOTAL HIP ARTHROPLASTY Left 03/19/2017  ? Procedure: LEFT TOTAL HIP ARTHROPLASTY ANTERIOR APPROACH;  Surgeon: Durene Romans, MD;  Location: [facility];  Service: Orthopedics;  Laterality: Left;  70 mins  ? TOTAL HIP ARTHROPLASTY Right 06/25/2017  ? Procedure: RIGHT TOTAL HIP ARTHROPLASTY ANTERIOR APPROACH;  Surgeon: Durene Romans, MD;  Location: [facility];  Service: Orthopedics;  Laterality: Right;  70 mins  ? ? ? ?Allergies  ?Allergen Reactions  ? Penicillins Rash and Other (See Comments)  ?  Has patient had a PCN reaction causing immediate rash, facial/tongue/throat swelling, SOB or lightheadedness with hypotension: yes ?Has patient had a PCN reaction causing severe rash involving mucus membranes or skin necrosis: No ?Has patient had a PCN reaction that required hospitalization No ?Has patient had a PCN reaction occurring within the last 10 years: No ?If all of the above answers are "NO", then may proceed with Cephalosporin use.  ? ? ? ? ?Family History  ?Problem Relation Age of Onset  ? High blood pressure Sister   ? Breast cancer Sister   ? Colon cancer Brother   ? Bone cancer Brother   ? ? ? ?Social History ?[name] reports that he has never smoked. His smokeless tobacco use includes snuff. ?[name] reports current alcohol use. ? ? ?Review of Systems ?CONSTITUTIONAL: No weight loss, fever,  chills, weakness or fatigue.  ?HEENT: Eyes: No visual loss, blurred vision, double vision or yellow sclerae.No hearing loss, sneezing, congestion, runny nose or sore throat.  ?SKIN: No rash or itching.  ?CARDIOVASCULAR: per hpi ?RESPIRATORY: No shortness of breath, cough or sputum.  ?GASTROINTESTINAL: No anorexia, nausea, vomiting or diarrhea. No abdominal pain or blood.  ?GENITOURINARY: No burning on urination, no polyuria ?NEUROLOGICAL: No headache, dizziness, syncope, paralysis, ataxia, numbness or tingling in the extremities. No change in bowel or  bladder control.  ?MUSCULOSKELETAL: No muscle, back pain, joint pain or stiffness.  ?LYMPHATICS: No enlarged nodes. No history of splenectomy.  ?PSYCHIATRIC: No history of depression or anxiety.  ?ENDOCRINOLOGIC: No reports of sweating, cold or heat intolerance. No polyuria or polydipsia.  ?. ? ? ?Physical Examination ?Today's Vitals  ? 06/15/21 0828  ?BP: (!) 150/88  ?Pulse: (!) 46  ?SpO2: 98%  ?Weight: 235 lb 9.6 oz (106.9 kg)  ?Height: 5\' 10"  (1.778 m)  ? ?Body mass index is 33.81 kg/m?. ? ?Gen: resting comfortably, no acute distress ?HEENT: no scleral icterus, pupils equal round and reactive, no palptable cervical adenopathy,  ?CV: regular, brady, no m/r/g no jvd ?Resp: Clear to auscultation bilaterally ?GI: abdomen is soft, non-tender, non-distended, normal bowel sounds, no hepatosplenomegaly ?MSK: extremities are warm, no edema.  ?Skin: warm, no rash ?Neuro:  no focal deficits ?Psych: appropriate affect ? ? ?Diagnostic Studies ? ? ? ? ?Assessment and Plan  ?1.Aflutter ?- prior ablation ?- EKG today shows he is back in aflutter with HRs in 40s.  ?- to start eliquis at home, initially had some issues meeting deducible but reports that is resolved ?- HRs remain low, lower coreg to 6.25mg  bid ?- plan for DCCV once stable on eliquis at least 3 weeks ?  ?2. HTN ?- remains above goal, increase norvasc to 10mg  daily ? ?3. Hyperlipidemia ?- 10 year ASCVD risk around 18%, moderate risk ?- we discussed benefits of statin therapy and lowering risk of cardiovascular events, start atorva 40mg  daily.  ? ? ?F/u 3 months. If stable on eliquis consider DCCV then ? ? ? , M.D. ?

## 2021-06-16 ENCOUNTER — Ambulatory Visit: Payer: Medicare Other | Admitting: Cardiology

## 2021-07-08 ENCOUNTER — Encounter: Payer: Self-pay | Admitting: Cardiology

## 2021-07-14 NOTE — Telephone Encounter (Signed)
Swelling that is heart related would be equal in both legs, often with other associated symptoms like shortness of breath with activities or laying down. If isolated swelling in one leg without other symptoms would not think its a cardiac issue ? ?Dominga Ferry MD ?

## 2021-07-17 NOTE — Telephone Encounter (Signed)
BP's little bit high. We did increase his norvasc last visit which can cause some leg swelling but once again that's typically both legs. Could lower norvasc back to 5mg  daily and update on swelling in 2 weeks. If better we may consider alternative medication change for his blood pressure ? ? ?[place] MD ?

## 2021-08-11 ENCOUNTER — Telehealth: Payer: Self-pay

## 2021-08-11 NOTE — Telephone Encounter (Signed)
Have him stop norvasc, start chlorthalidone 12.5mg  daily with bmet/mg/bnp in 2 weeks and update [place] again in 2 weeks please   Dominga Ferry MD      Note    Eustace Moore, RN routed conversation to Antoine Poche, MD Yesterday (9:22 AM)   Jonne Ply  P [facility] Triage (supporting Antoine Poche, MD) Yesterday (5:42 AM)   Per Pt: Swelling in ankles still present      08/11/21 4:14 pm   Attempt to reach, no answer,no voicemail  Attempt to reach contact person: no voicemail

## 2021-08-11 NOTE — Telephone Encounter (Signed)
Have him stop norvasc, start chlorthalidone 12.5mg  daily with bmet/mg/bnp in 2 weeks and update [place] again in 2 weeks please  Zandra Abts MD

## 2021-08-14 NOTE — Telephone Encounter (Signed)
Glad swellnig is better, we will see how labs look with medication change. Shoulder blade pain not likely related, would have him discuss with pcp  Dominga Ferry MD

## 2021-09-14 ENCOUNTER — Ambulatory Visit: Payer: Medicare PPO | Admitting: Cardiology

## 2021-09-14 ENCOUNTER — Encounter: Payer: Self-pay | Admitting: Cardiology

## 2021-09-14 VITALS — BP 126/90 | HR 46 | Ht 70.0 in | Wt 239.0 lb

## 2021-09-14 DIAGNOSIS — I10 Essential (primary) hypertension: Secondary | ICD-10-CM

## 2021-09-14 DIAGNOSIS — I4892 Unspecified atrial flutter: Secondary | ICD-10-CM

## 2021-09-14 DIAGNOSIS — E782 Mixed hyperlipidemia: Secondary | ICD-10-CM

## 2021-09-14 MED ORDER — CARVEDILOL 3.125 MG PO TABS: 3.1250 mg | ORAL_TABLET | Freq: Two times a day (BID) | ORAL | 1 refills | Status: DC

## 2021-09-14 MED ORDER — CHLORTHALIDONE 25 MG PO TABS: 12.5000 mg | ORAL_TABLET | Freq: Every day | ORAL | 1 refills | Status: DC

## 2021-09-14 NOTE — Progress Notes (Signed)
Clinical Summary [name] is a 66 y.o.male seen today for follow up of the following medical problems.     1.HTN - compliant with meds - reports multiple changes while he was living in [place].    - compliant with meds - has had some LE edema on norvasc 10mg , we lowered dose with some initialy improvement but has reoccured.      2. Afluttter - history of aflutter ablation, however has had recurrent aflutter   - eliquis was too expensive,ongoing talks with his insurance, has to meet deductible but states will be able to start. Has not restarted eliquis since our last visit - HR's low last visit, lowered coreg to 6.25mg  bid.  - no recent palpitations. - no lightheadedness or dizziness - has not gotten eliquis, working to get money for deductible.    3. Hyperlipidemia/Primary prevention LDL 147 ASCVD risk 18.1% - last visit we started atorvastatin.       Past Medical History:  Diagnosis Date   Dysrhythmia 02/2017   A flutter   Hypertension      Allergies  Allergen Reactions   Penicillins Rash and Other (See Comments)    Has patient had a PCN reaction causing immediate rash, facial/tongue/throat swelling, SOB or lightheadedness with hypotension: yes Has patient had a PCN reaction causing severe rash involving mucus membranes or skin necrosis: No Has patient had a PCN reaction that required hospitalization No Has patient had a PCN reaction occurring within the last 10 years: No If all of the above answers are "NO", then may proceed with Cephalosporin use.     Current Outpatient Medications  Medication Sig Dispense Refill   amLODipine (NORVASC) 10 MG tablet Take 1 tablet (10 mg total) by mouth daily. 30 tablet 6   apixaban (ELIQUIS) 5 MG TABS tablet Take 1 tablet (5 mg total) by mouth 2 (two) times daily. (Patient not taking: Reported on 06/15/2021) 60 tablet 6   atorvastatin (LIPITOR) 40 MG tablet Take 1 tablet (40 mg total) by mouth daily. 30 tablet 6    carvedilol (COREG) 6.25 MG tablet Take 1 tablet (6.25 mg total) by mouth 2 (two) times daily. 60 tablet 6   cloNIDine (CATAPRES) 0.1 MG tablet Take 0.1 mg by mouth 2 (two) times daily.     lisinopril (ZESTRIL) 40 MG tablet Take 1 tablet (40 mg total) by mouth daily. 30 tablet 6   No current facility-administered medications for this visit.     Past Surgical History:  Procedure Laterality Date   A-FLUTTER ABLATION N/A 08/16/2017   Procedure: A-FLUTTER ABLATION;  Surgeon: 08/18/2017, MD;  Location: [facility];  Service: Cardiovascular;  Laterality: N/A;   ATRIAL FLUTTER ABLATION  08/16/2017   JOINT REPLACEMENT     TOTAL HIP ARTHROPLASTY Left 03/19/2017   Procedure: LEFT TOTAL HIP ARTHROPLASTY ANTERIOR APPROACH;  Surgeon: 03/21/2017, MD;  Location: [facility];  Service: Orthopedics;  Laterality: Left;  70 mins   TOTAL HIP ARTHROPLASTY Right 06/25/2017   Procedure: RIGHT TOTAL HIP ARTHROPLASTY ANTERIOR APPROACH;  Surgeon: 06/27/2017, MD;  Location: [facility];  Service: Orthopedics;  Laterality: Right;  70 mins     Allergies  Allergen Reactions   Penicillins Rash and Other (See Comments)    Has patient had a PCN reaction causing immediate rash, facial/tongue/throat swelling, SOB or lightheadedness with hypotension: yes Has patient had a PCN reaction causing severe rash involving mucus membranes or skin necrosis: No Has patient had a PCN  reaction that required hospitalization No Has patient had a PCN reaction occurring within the last 10 years: No If all of the above answers are "NO", then may proceed with Cephalosporin use.      Family History  Problem Relation Age of Onset   High blood pressure Sister    Breast cancer Sister    Colon cancer Brother    Bone cancer Brother      Social History [name] reports that he has never smoked. He has never been exposed to tobacco smoke. His smokeless tobacco use includes snuff. [name] reports current alcohol  use.   Review of Systems CONSTITUTIONAL: No weight loss, fever, chills, weakness or fatigue.  HEENT: Eyes: No visual loss, blurred vision, double vision or yellow sclerae.No hearing loss, sneezing, congestion, runny nose or sore throat.  SKIN: No rash or itching.  CARDIOVASCULAR: per hpi RESPIRATORY: No shortness of breath, cough or sputum.  GASTROINTESTINAL: No anorexia, nausea, vomiting or diarrhea. No abdominal pain or blood.  GENITOURINARY: No burning on urination, no polyuria NEUROLOGICAL: No headache, dizziness, syncope, paralysis, ataxia, numbness or tingling in the extremities. No change in bowel or bladder control.  MUSCULOSKELETAL: No muscle, back pain, joint pain or stiffness.  LYMPHATICS: No enlarged nodes. No history of splenectomy.  PSYCHIATRIC: No history of depression or anxiety.  ENDOCRINOLOGIC: No reports of sweating, cold or heat intolerance. No polyuria or polydipsia.  Marland Kitchen   Physical Examination Today's Vitals   09/14/21 0846  BP: 126/90  Pulse: (!) 46  SpO2: 97%  Weight: 239 lb (108.4 kg)  Height: 5\' 10"  (1.778 m)   Body mass index is 34.29 kg/m.  Gen: resting comfortably, no acute distress HEENT: no scleral icterus, pupils equal round and reactive, no palptable cervical adenopathy,  CV: irreg, brady, no m/r/g no jvd Resp: Clear to auscultation bilaterally GI: abdomen is soft, non-tender, non-distended, normal bowel sounds, no hepatosplenomegaly MSK: extremities are warm, no edema.  Skin: warm, no rash Neuro:  no focal deficits Psych: appropriate affect     Assessment and Plan   1.Aflutter - prior ablation - recurrent aflutter with slow ventricular response - ongoing issues meeting deducible for his eliquis. We discussed starting coumadin however not in favor, states will go ahead and meet deductible and start eliquis. If can establish compliance with eliquis could consider DCCV   2. HTN - swelling on norvasc, will d/c and start chlrothalidone  12.5mg  daily. CHeck bmet/mg in 2 weeks   3. Hyperlipidemia - continue statin         , M.D.,

## 2021-09-14 NOTE — Patient Instructions (Signed)
Medication Instructions:  Your physician has recommended you make the following change in your medication:  Stop Norvasc Start chlorthalidone 12.5 mg once a day Decrease carvedilol to 3.125 mg twice a day Take all other medications as prescribed  Labwork: In two weeks @ [facility]  Testing/Procedures: None  Follow-Up:  Your physician recommends that you schedule a follow-up appointment in: 6 months  Any Other Special Instructions Will Be Listed Below (If Applicable).  If you need a refill on your cardiac medications before your next appointment, please call your pharmacy.

## 2021-11-18 ENCOUNTER — Other Ambulatory Visit: Payer: Self-pay | Admitting: Cardiology

## 2021-11-18 ENCOUNTER — Encounter: Payer: Self-pay | Admitting: Cardiology

## 2021-11-20 NOTE — Telephone Encounter (Signed)
May not need to get back on clonidine, does he have a way to check his bp's? If so can he update [place]. If not could set up a nursing visti for a check   Zandra Abts MD

## 2021-11-22 ENCOUNTER — Encounter: Payer: Self-pay | Admitting: *Deleted

## 2021-12-14 ENCOUNTER — Other Ambulatory Visit: Payer: Self-pay | Admitting: Cardiology

## 2021-12-20 NOTE — Telephone Encounter (Signed)
Left message for patient to call office or respond to mychart message.

## 2021-12-21 NOTE — Telephone Encounter (Signed)
Patient states that he does not need any medications refilled right now. States that he is going to take his blood pressure twice a day and report the readings next Thursday so that Dr. Harl Bowie can determine if the chlorthalidone is something that he needs to continue to take.

## 2022-01-01 NOTE — Telephone Encounter (Signed)
Elevated bp's most of the time, can he clarify his current regimen. I thought he had run out of clonidine but still showing on his list. Has he had recent blood work, I don't see he had the labs we ordered in July  J Yezenia Fredrick MD

## 2022-01-03 NOTE — Telephone Encounter (Signed)
Can we order a bmet and mg for him please, can go ahead and refill his meds.   Zandra Abts MD

## 2022-01-04 ENCOUNTER — Other Ambulatory Visit: Payer: Self-pay

## 2022-01-04 DIAGNOSIS — I10 Essential (primary) hypertension: Secondary | ICD-10-CM

## 2022-01-04 DIAGNOSIS — Z79899 Other long term (current) drug therapy: Secondary | ICD-10-CM

## 2022-01-04 MED ORDER — LISINOPRIL 40 MG PO TABS: 40.0000 mg | ORAL_TABLET | Freq: Every day | ORAL | 1 refills | Status: DC

## 2022-01-04 MED ORDER — CHLORTHALIDONE 25 MG PO TABS: 12.5000 mg | ORAL_TABLET | Freq: Every day | ORAL | 1 refills | Status: DC

## 2022-01-08 NOTE — Addendum Note (Signed)
Addended by: Sung Amabile on: 01/08/2022 07:46 AM   Modules accepted: Orders

## 2022-01-08 NOTE — Telephone Encounter (Signed)
Orders for bmet and magnesium placed per Dr. Harl Bowie, 12/14/21 refill encounter. Patient made aware and states that he would like to have labs done in [place]. Orders in for [facility] at [facility]. MyChart message sent informing patient that orders will be at the front desk of the [place] office for pickup.

## 2022-01-12 ENCOUNTER — Telehealth: Payer: Self-pay | Admitting: Cardiology

## 2022-01-12 NOTE — Telephone Encounter (Signed)
Patient is requesting to have his lab orders printed out so that he can go by the [place] office and pick them up today.

## 2022-01-12 NOTE — Telephone Encounter (Signed)
Returned call to pt. No answer. Left msg to call back.  

## 2022-01-18 ENCOUNTER — Encounter: Payer: Self-pay | Admitting: *Deleted

## 2022-01-18 NOTE — Telephone Encounter (Signed)
Per the front office staff, patient did go to the [place] office to pick up lab orders.

## 2022-03-06 ENCOUNTER — Encounter: Payer: Self-pay | Admitting: Cardiology

## 2022-03-07 NOTE — Telephone Encounter (Signed)
Had been well controlled at our last visit, can we set a nursing visit for bp check and bring home cuff with him please   Zandra Abts MD

## 2022-03-09 ENCOUNTER — Ambulatory Visit: Payer: Medicare PPO

## 2022-03-12 ENCOUNTER — Ambulatory Visit: Payer: Medicare PPO | Attending: Cardiology | Admitting: *Deleted

## 2022-03-12 NOTE — Progress Notes (Unsigned)
Patient replied via mychart with his current bp readings on his cuff at home.   At 10:30am 155/94/41

## 2022-03-12 NOTE — Progress Notes (Unsigned)
Patient in office for nurse BP check - did not bring his home cuff for comparison.   142/92  48   States these readings are consistent for him.   HR avg 46-52  No acute complaints at this time.    States he will check BP when he gets home on his cuff & send reading back via mychart.

## 2022-03-14 ENCOUNTER — Encounter: Payer: Self-pay | Admitting: *Deleted

## 2022-03-14 NOTE — Progress Notes (Signed)
Patient notified via mychart

## 2022-03-14 NOTE — Progress Notes (Signed)
Heart rates on the low side simialr to what we saw at our appointment when we lowered his coreg, has he had any lightheadedness of dizziness? BP's still too high, can he start aldactone 12.5mg  daily. Needs bmet in 2 weeks. Update bp's in 2 weeks  Bettey Costa MD

## 2022-03-15 ENCOUNTER — Other Ambulatory Visit: Payer: Self-pay | Admitting: *Deleted

## 2022-03-15 DIAGNOSIS — I10 Essential (primary) hypertension: Secondary | ICD-10-CM

## 2022-03-15 DIAGNOSIS — Z79899 Other long term (current) drug therapy: Secondary | ICD-10-CM

## 2022-03-15 MED ORDER — SPIRONOLACTONE 25 MG PO TABS: 12.5000 mg | ORAL_TABLET | Freq: Every day | ORAL | 6 refills | Status: DC

## 2022-03-29 ENCOUNTER — Encounter: Payer: Self-pay | Admitting: Cardiology

## 2022-03-29 ENCOUNTER — Other Ambulatory Visit: Payer: Self-pay | Admitting: Cardiology

## 2022-03-29 NOTE — Telephone Encounter (Signed)
Bps are elevatd, will go over everything in detail and talk about best options at our appt Monday  Zandra Abts MD

## 2022-04-02 ENCOUNTER — Encounter: Payer: Self-pay | Admitting: Cardiology

## 2022-04-02 ENCOUNTER — Ambulatory Visit: Payer: Medicare PPO | Attending: Cardiology | Admitting: Cardiology

## 2022-04-02 VITALS — BP 150/90 | HR 45 | Ht 69.5 in | Wt 238.2 lb

## 2022-04-02 DIAGNOSIS — E782 Mixed hyperlipidemia: Secondary | ICD-10-CM | POA: Diagnosis not present

## 2022-04-02 DIAGNOSIS — I4892 Unspecified atrial flutter: Secondary | ICD-10-CM

## 2022-04-02 DIAGNOSIS — I10 Essential (primary) hypertension: Secondary | ICD-10-CM | POA: Diagnosis not present

## 2022-04-02 MED ORDER — SPIRONOLACTONE 25 MG PO TABS: 25.0000 mg | ORAL_TABLET | Freq: Every day | ORAL | 6 refills | Status: DC

## 2022-04-02 MED ORDER — HYDRALAZINE HCL 25 MG PO TABS: 25.0000 mg | ORAL_TABLET | Freq: Two times a day (BID) | ORAL | 6 refills | Status: DC

## 2022-04-02 NOTE — Patient Instructions (Signed)
Medication Instructions:  Remain off of the Clonidine - removed from list today Stop Coreg (Carvedilol) Increase Aldactone to 25mg  daily Begin Hydralazine 25mg  twice a day   Continue all other medications.     Labwork: none  Testing/Procedures: none  Follow-Up: 6 months   Any Other Special Instructions Will Be Listed Below (If Applicable).   If you need a refill on your cardiac medications before your next appointment, please call your pharmacy.

## 2022-04-02 NOTE — Progress Notes (Signed)
Clinical Summary Luis Nunez is a 67 y.o.male seen today for follow up of the following medical problems.      1.HTN - compliant with meds - reports multiple changes while he was living in [place].    - compliant with meds - has had some LE edema on norvasc 10mg , we lowered dose with some initialy improvement but has reoccured.    - bp log Jan 2024 elevated bp's.   Cr 1.69 in Jan 2024, was 1.5 in Nov. Historically 1.3-1.6 in the past.  - working to stay hydrated     2. Afluttter - history of aflutter ablation, however has had recurrent aflutter -aflutter has been asymptomatic and rate controlled, however low rates have been weaning coreg  - rare dizziness at times - no recent palpitations.  - no bleeding on eliquis   3. Hyperlipidemia/Primary prevention LDL 147 ASCVD risk 18.1% - last visit we started atorvastatin.   - needs repeat lipids Past Medical History:  Diagnosis Date   Dysrhythmia 02/2017   A flutter   Hypertension      Allergies  Allergen Reactions   Penicillins Rash and Other (See Comments)    Has patient had a PCN reaction causing immediate rash, facial/tongue/throat swelling, SOB or lightheadedness with hypotension: yes Has patient had a PCN reaction causing severe rash involving mucus membranes or skin necrosis: No Has patient had a PCN reaction that required hospitalization No Has patient had a PCN reaction occurring within the last 10 years: No If all of the above answers are "NO", then may proceed with Cephalosporin use.     Current Outpatient Medications  Medication Sig Dispense Refill   apixaban (ELIQUIS) 5 MG TABS tablet Take 1 tablet (5 mg total) by mouth 2 (two) times daily. 60 tablet 6   atorvastatin (LIPITOR) 40 MG tablet TAKE 1 TABLET BY MOUTH EVERY DAY 90 tablet 2   chlorthalidone (HYGROTON) 25 MG tablet Take 0.5 tablets (12.5 mg total) by mouth daily. 45 tablet 1   hydrALAZINE (APRESOLINE) 25 MG tablet Take 1 tablet (25 mg  total) by mouth 2 (two) times daily. 60 tablet 6   lisinopril (ZESTRIL) 40 MG tablet Take 1 tablet (40 mg total) by mouth daily. 90 tablet 1   spironolactone (ALDACTONE) 25 MG tablet Take 1 tablet (25 mg total) by mouth daily. 30 tablet 6   No current facility-administered medications for this visit.     Past Surgical History:  Procedure Laterality Date   A-FLUTTER ABLATION N/A 08/16/2017   Procedure: A-FLUTTER ABLATION;  Surgeon: Constance Haw, MD;  Location: [facility];  Service: Cardiovascular;  Laterality: N/A;   ATRIAL FLUTTER ABLATION  08/16/2017   JOINT REPLACEMENT     TOTAL HIP ARTHROPLASTY Left 03/19/2017   Procedure: LEFT TOTAL HIP ARTHROPLASTY ANTERIOR APPROACH;  Surgeon: Paralee Cancel, MD;  Location: [facility];  Service: Orthopedics;  Laterality: Left;  70 mins   TOTAL HIP ARTHROPLASTY Right 06/25/2017   Procedure: RIGHT TOTAL HIP ARTHROPLASTY ANTERIOR APPROACH;  Surgeon: Paralee Cancel, MD;  Location: [facility];  Service: Orthopedics;  Laterality: Right;  70 mins     Allergies  Allergen Reactions   Penicillins Rash and Other (See Comments)    Has patient had a PCN reaction causing immediate rash, facial/tongue/throat swelling, SOB or lightheadedness with hypotension: yes Has patient had a PCN reaction causing severe rash involving mucus membranes or skin necrosis: No Has patient had a PCN reaction that required hospitalization No Has  patient had a PCN reaction occurring within the last 10 years: No If all of the above answers are "NO", then may proceed with Cephalosporin use.      Family History  Problem Relation Age of Onset   High blood pressure Sister    Breast cancer Sister    Colon cancer Brother    Bone cancer Brother      Social History Luis Nunez reports that he has never smoked. He has never been exposed to tobacco smoke. His smokeless tobacco use includes snuff. Luis Nunez reports current alcohol use.   Review of Systems CONSTITUTIONAL:  No weight loss, fever, chills, weakness or fatigue.  HEENT: Eyes: No visual loss, blurred vision, double vision or yellow sclerae.No hearing loss, sneezing, congestion, runny nose or sore throat.  SKIN: No rash or itching.  CARDIOVASCULAR: per hpi RESPIRATORY: No shortness of breath, cough or sputum.  GASTROINTESTINAL: No anorexia, nausea, vomiting or diarrhea. No abdominal pain or blood.  GENITOURINARY: No burning on urination, no polyuria NEUROLOGICAL: No headache, dizziness, syncope, paralysis, ataxia, numbness or tingling in the extremities. No change in bowel or bladder control.  MUSCULOSKELETAL: No muscle, back pain, joint pain or stiffness.  LYMPHATICS: No enlarged nodes. No history of splenectomy.  PSYCHIATRIC: No history of depression or anxiety.  ENDOCRINOLOGIC: No reports of sweating, cold or heat intolerance. No polyuria or polydipsia.  Marland Kitchen   Physical Examination Today's Vitals   04/02/22 0820 04/02/22 0847  BP: (!) 184/95 (!) 150/90  Pulse: (!) 45   SpO2: 100%   Weight: 238 lb 3.2 oz (108 kg)   Height: 5' 9.5" (1.765 m)    Body mass index is 34.67 kg/m.  Gen: resting comfortably, no acute distress HEENT: no scleral icterus, pupils equal round and reactive, no palptable cervical adenopathy,  CV: irregular, brady Resp: Clear to auscultation bilaterally GI: abdomen is soft, non-tender, non-distended, normal bowel sounds, no hepatosplenomegaly MSK: extremities are warm, no edema.  Skin: warm, no rash Neuro:  no focal deficits Psych: appropriate affect   Diagnostic Studies     Assessment and Plan  1.Aflutter/acquired thrombophilia - prior ablation - recurrent aflutter with slow ventricular response. Flutter has been asymptomatic and mixed compliance with anticoag so have not pursued DCCV - we will d/c coreg due to low HRs, continue elisquis   2. HTN - swelling on norvasc, - some renal dysfunction affects med options - start hydralazine 25mg  bid, stopping  coreg due to low HRs. Increase aldactone to 25mg  daily - likely check bmet again after he updates [place] on bp's end of week   3. Hyperlipidemia - continue current meds, repeat lipid panel at next labs    F/u 6 months  Arnoldo Lenis, M.D.

## 2022-04-20 ENCOUNTER — Telehealth: Payer: Self-pay | Admitting: Cardiology

## 2022-04-20 NOTE — Telephone Encounter (Signed)
Pt is calling to report BP readings as requested.   2/7 -    137/76 41 2/8 -    155/91 51 2/9 -    159/88 44 2/10 -   128/84 52 2/11 -    154/89 40 2/12 -   135/81 43 2/13 -   139/75 63

## 2022-04-30 ENCOUNTER — Other Ambulatory Visit: Payer: Self-pay | Admitting: *Deleted

## 2022-04-30 DIAGNOSIS — I10 Essential (primary) hypertension: Secondary | ICD-10-CM

## 2022-04-30 DIAGNOSIS — Z79899 Other long term (current) drug therapy: Secondary | ICD-10-CM

## 2022-04-30 MED ORDER — HYDRALAZINE HCL 25 MG PO TABS: 37.5000 mg | ORAL_TABLET | Freq: Two times a day (BID) | ORAL | Status: DC

## 2022-04-30 NOTE — Telephone Encounter (Signed)
BP's getting close, can he increase hydralazine to 37.'5mg'$  bid and update [place] next week. Needs bmet please due to medication changes last visit  Zandra Abts MD

## 2022-05-15 ENCOUNTER — Other Ambulatory Visit: Payer: Self-pay | Admitting: *Deleted

## 2022-05-15 MED ORDER — HYDRALAZINE HCL 50 MG PO TABS: 50.0000 mg | ORAL_TABLET | Freq: Two times a day (BID) | ORAL | 6 refills | Status: DC

## 2022-05-15 NOTE — Telephone Encounter (Signed)
BMET shows some mild but stable kidney dysfunction. SOB not a common side effect of hydralazine. BP's remain elevated. I would try staying with the hydralazine due to limited other options and actually increase the dose to '50mg'$  bid. If significant progression of SOB let us know  Zandra Abts MD

## 2022-05-31 NOTE — Telephone Encounter (Signed)
Majority of bp's are at goal, can hold at current doses for the time being.   Zandra Abts MD

## 2022-06-08 ENCOUNTER — Other Ambulatory Visit: Payer: Self-pay | Admitting: *Deleted

## 2022-06-08 ENCOUNTER — Other Ambulatory Visit: Payer: Self-pay | Admitting: Cardiology

## 2022-06-08 MED ORDER — HYDRALAZINE HCL 50 MG PO TABS: 50.0000 mg | ORAL_TABLET | Freq: Two times a day (BID) | ORAL | 6 refills | Status: DC

## 2022-06-16 ENCOUNTER — Encounter: Payer: Self-pay | Admitting: Cardiology

## 2022-08-05 ENCOUNTER — Other Ambulatory Visit: Payer: Self-pay | Admitting: Cardiology

## 2022-08-06 ENCOUNTER — Telehealth: Payer: Self-pay | Admitting: Cardiology

## 2022-08-06 MED ORDER — SPIRONOLACTONE 25 MG PO TABS: 25.0000 mg | ORAL_TABLET | Freq: Every day | ORAL | 1 refills | Status: DC

## 2022-08-06 NOTE — Telephone Encounter (Signed)
Patient notified refill will be sent to [facility] now for Spironolactone 25mg  daily - # 90 tabs + 1 refill.

## 2022-08-06 NOTE — Telephone Encounter (Signed)
Pt c/o medication issue:  1. Name of Medication:   spironolactone (ALDACTONE) 25 MG tablet    2. How are you currently taking this medication (dosage and times per day)? As prescribed   3. Are you having a reaction (difficulty breathing--STAT)? No   4. What is your medication issue? Pharmacy advised pt's refill request was denied and was told to contact the office.   Please advise.

## 2022-10-02 ENCOUNTER — Other Ambulatory Visit: Payer: Self-pay | Admitting: Cardiology

## 2022-10-03 ENCOUNTER — Other Ambulatory Visit: Payer: Self-pay | Admitting: Cardiology

## 2022-10-09 ENCOUNTER — Ambulatory Visit: Payer: Medicare PPO | Admitting: Cardiology

## 2022-10-09 NOTE — Progress Notes (Deleted)
Clinical Summary Luis Nunez is a 67 y.o.male  seen today for follow up of the following medical problems.      1.HTN - compliant with meds - reports multiple changes while he was living in [place].    - compliant with meds - has had some LE edema on norvasc 10mg , we lowered dose with some initialy improvement but has reoccured.      - bp log Jan 2024 elevated bp's.   Cr 1.69 in Jan 2024, was 1.5 in Nov. Historically 1.3-1.6 in the past.  - working to stay hydrated         2. Afluttter - history of aflutter ablation, however has had recurrent aflutter -aflutter has been asymptomatic and rate controlled, however low rates have been weaning coreg   - rare dizziness at times - no recent palpitations.  - no bleeding on eliquis   3. Hyperlipidemia/Primary prevention LDL 147 ASCVD risk 18.1% - last visit we started atorvastatin.    - needs repeat lipids Past Medical History:  Diagnosis Date   Dysrhythmia 02/2017   A flutter   Hypertension      Allergies  Allergen Reactions   Penicillins Rash and Other (See Comments)    Has patient had a PCN reaction causing immediate rash, facial/tongue/throat swelling, SOB or lightheadedness with hypotension: yes Has patient had a PCN reaction causing severe rash involving mucus membranes or skin necrosis: No Has patient had a PCN reaction that required hospitalization No Has patient had a PCN reaction occurring within the last 10 years: No If all of the above answers are "NO", then may proceed with Cephalosporin use.     Current Outpatient Medications  Medication Sig Dispense Refill   apixaban (ELIQUIS) 5 MG TABS tablet Take 1 tablet (5 mg total) by mouth 2 (two) times daily. 60 tablet 6   atorvastatin (LIPITOR) 40 MG tablet TAKE 1 TABLET BY MOUTH EVERY DAY 90 tablet 2   chlorthalidone (HYGROTON) 25 MG tablet TAKE 1/2 TABLET BY MOUTH EVERY DAY 45 tablet 1   hydrALAZINE (APRESOLINE) 25 MG tablet TAKE 1 TABLET BY MOUTH  TWICE A DAY 180 tablet 2   hydrALAZINE (APRESOLINE) 50 MG tablet Take 1 tablet (50 mg total) by mouth 2 (two) times daily. 60 tablet 6   lisinopril (ZESTRIL) 40 MG tablet TAKE 1 TABLET BY MOUTH EVERY DAY 90 tablet 1   spironolactone (ALDACTONE) 25 MG tablet Take 1 tablet (25 mg total) by mouth daily. 90 tablet 1   No current facility-administered medications for this visit.     Past Surgical History:  Procedure Laterality Date   A-FLUTTER ABLATION N/A 08/16/2017   Procedure: A-FLUTTER ABLATION;  Surgeon: Regan Lemming, MD;  Location: [facility];  Service: Cardiovascular;  Laterality: N/A;   ATRIAL FLUTTER ABLATION  08/16/2017   JOINT REPLACEMENT     TOTAL HIP ARTHROPLASTY Left 03/19/2017   Procedure: LEFT TOTAL HIP ARTHROPLASTY ANTERIOR APPROACH;  Surgeon: Durene Romans, MD;  Location: [facility];  Service: Orthopedics;  Laterality: Left;  70 mins   TOTAL HIP ARTHROPLASTY Right 06/25/2017   Procedure: RIGHT TOTAL HIP ARTHROPLASTY ANTERIOR APPROACH;  Surgeon: Durene Romans, MD;  Location: [facility];  Service: Orthopedics;  Laterality: Right;  70 mins     Allergies  Allergen Reactions   Penicillins Rash and Other (See Comments)    Has patient had a PCN reaction causing immediate rash, facial/tongue/throat swelling, SOB or lightheadedness with hypotension: yes Has patient had a  PCN reaction causing severe rash involving mucus membranes or skin necrosis: No Has patient had a PCN reaction that required hospitalization No Has patient had a PCN reaction occurring within the last 10 years: No If all of the above answers are "NO", then may proceed with Cephalosporin use.      Family History  Problem Relation Age of Onset   High blood pressure Sister    Breast cancer Sister    Colon cancer Brother    Bone cancer Brother      Social History Luis Nunez reports that he has never smoked. He has never been exposed to tobacco smoke. His smokeless tobacco use includes snuff. [name] reports current alcohol use.   Review of Systems CONSTITUTIONAL: No weight loss, fever, chills, weakness or fatigue.  HEENT: Eyes: No visual loss, blurred vision, double vision or yellow sclerae.No hearing loss, sneezing, congestion, runny nose or sore throat.  SKIN: No rash or itching.  CARDIOVASCULAR:  RESPIRATORY: No shortness of breath, cough or sputum.  GASTROINTESTINAL: No anorexia, nausea, vomiting or diarrhea. No abdominal pain or blood.  GENITOURINARY: No burning on urination, no polyuria NEUROLOGICAL: No headache, dizziness, syncope, paralysis, ataxia, numbness or tingling in the extremities. No change in bowel or bladder control.  MUSCULOSKELETAL: No muscle, back pain, joint pain or stiffness.  LYMPHATICS: No enlarged nodes. No history of splenectomy.  PSYCHIATRIC: No history of depression or anxiety.  ENDOCRINOLOGIC: No reports of sweating, cold or heat intolerance. No polyuria or polydipsia.  Marland Kitchen   Physical Examination There were no vitals filed for this visit. There were no vitals filed for this visit.  Gen: resting comfortably, no acute distress HEENT: no scleral icterus, pupils equal round and reactive, no palptable cervical adenopathy,  CV Resp: Clear to auscultation bilaterally GI: abdomen is soft, non-tender, non-distended, normal bowel sounds, no hepatosplenomegaly MSK: extremities are warm, no edema.  Skin: warm, no rash Neuro:  no focal deficits Psych: appropriate affect   Diagnostic Studies     Assessment and Plan   1.Aflutter/acquired thrombophilia - prior ablation - recurrent aflutter with slow ventricular response. Flutter has been asymptomatic and mixed compliance with anticoag so have not pursued DCCV - we will d/c coreg due to low HRs, continue elisquis   2. HTN - swelling on norvasc, - some renal dysfunction affects med options - start hydralazine 25mg  bid, stopping coreg due to low HRs. Increase aldactone to 25mg  daily - likely  check bmet again after he updates [place] on bp's end of week   3. Hyperlipidemia - continue current meds, repeat lipid panel at next labs     Antoine Poche, M.D., F.A.C.C.

## 2022-10-12 ENCOUNTER — Ambulatory Visit: Payer: Medicare PPO | Attending: Cardiology | Admitting: Cardiology

## 2022-10-12 ENCOUNTER — Encounter: Payer: Self-pay | Admitting: Cardiology

## 2022-10-12 VITALS — BP 128/78 | HR 60 | Ht 69.0 in | Wt 221.0 lb

## 2022-10-12 DIAGNOSIS — I10 Essential (primary) hypertension: Secondary | ICD-10-CM | POA: Diagnosis not present

## 2022-10-12 DIAGNOSIS — E782 Mixed hyperlipidemia: Secondary | ICD-10-CM

## 2022-10-12 DIAGNOSIS — I4892 Unspecified atrial flutter: Secondary | ICD-10-CM

## 2022-10-12 NOTE — Progress Notes (Signed)
Clinical Summary Mr. Barrington is a 67 y.o.male seen today for follow up of the following medical problems.      1.HTN - has had some LE edema on norvasc, now off  - home bp's 120s/70s at home - compliant with meds         2. Afluttter - history of aflutter ablation, however has had recurrent aflutter -aflutter has been asymptomatic and rate controlled. Due to low rates coreg stopped, has been self rate controlled  - has not been able to start eliquis due to cost    3. Hyperlipidemia/Primary prevention - he is on atorvastatin - upcoming labs pending with pcp  4. CKD IIIA    Past Medical History:  Diagnosis Date   Dysrhythmia 02/2017   A flutter   Hypertension      Allergies  Allergen Reactions   Penicillins Rash and Other (See Comments)    Has patient had a PCN reaction causing immediate rash, facial/tongue/throat swelling, SOB or lightheadedness with hypotension: yes Has patient had a PCN reaction causing severe rash involving mucus membranes or skin necrosis: No Has patient had a PCN reaction that required hospitalization No Has patient had a PCN reaction occurring within the last 10 years: No If all of the above answers are "NO", then may proceed with Cephalosporin use.     Current Outpatient Medications  Medication Sig Dispense Refill   apixaban (ELIQUIS) 5 MG TABS tablet Take 1 tablet (5 mg total) by mouth 2 (two) times daily. 60 tablet 6   atorvastatin (LIPITOR) 40 MG tablet TAKE 1 TABLET BY MOUTH EVERY DAY 90 tablet 2   chlorthalidone (HYGROTON) 25 MG tablet TAKE 1/2 TABLET BY MOUTH EVERY DAY 45 tablet 1   hydrALAZINE (APRESOLINE) 25 MG tablet TAKE 1 TABLET BY MOUTH TWICE A DAY 180 tablet 2   hydrALAZINE (APRESOLINE) 50 MG tablet Take 1 tablet (50 mg total) by mouth 2 (two) times daily. 60 tablet 6   lisinopril (ZESTRIL) 40 MG tablet TAKE 1 TABLET BY MOUTH EVERY DAY 90 tablet 1   spironolactone (ALDACTONE) 25 MG tablet Take 1 tablet (25 mg total)  by mouth daily. 90 tablet 1   No current facility-administered medications for this visit.     Past Surgical History:  Procedure Laterality Date   A-FLUTTER ABLATION N/A 08/16/2017   Procedure: A-FLUTTER ABLATION;  Surgeon: Regan Lemming, MD;  Location: [facility];  Service: Cardiovascular;  Laterality: N/A;   ATRIAL FLUTTER ABLATION  08/16/2017   JOINT REPLACEMENT     TOTAL HIP ARTHROPLASTY Left 03/19/2017   Procedure: LEFT TOTAL HIP ARTHROPLASTY ANTERIOR APPROACH;  Surgeon: Durene Romans, MD;  Location: [facility];  Service: Orthopedics;  Laterality: Left;  70 mins   TOTAL HIP ARTHROPLASTY Right 06/25/2017   Procedure: RIGHT TOTAL HIP ARTHROPLASTY ANTERIOR APPROACH;  Surgeon: Durene Romans, MD;  Location: [facility];  Service: Orthopedics;  Laterality: Right;  70 mins     Allergies  Allergen Reactions   Penicillins Rash and Other (See Comments)    Has patient had a PCN reaction causing immediate rash, facial/tongue/throat swelling, SOB or lightheadedness with hypotension: yes Has patient had a PCN reaction causing severe rash involving mucus membranes or skin necrosis: No Has patient had a PCN reaction that required hospitalization No Has patient had a PCN reaction occurring within the last 10 years: No If all of the above answers are "NO", then may proceed with Cephalosporin use.  Family History  Problem Relation Age of Onset   High blood pressure Sister    Breast cancer Sister    Colon cancer Brother    Bone cancer Brother      Social History Mr. Hansard reports that he has never smoked. He has never been exposed to tobacco smoke. His smokeless tobacco use includes snuff. Mr. Barousse reports current alcohol use.   Review of Systems CONSTITUTIONAL: No weight loss, fever, chills, weakness or fatigue.  HEENT: Eyes: No visual loss, blurred vision, double vision or yellow sclerae.No hearing loss, sneezing, congestion, runny nose or sore throat.  SKIN: No rash  or itching.  CARDIOVASCULAR: per hpi RESPIRATORY: No shortness of breath, cough or sputum.  GASTROINTESTINAL: No anorexia, nausea, vomiting or diarrhea. No abdominal pain or blood.  GENITOURINARY: No burning on urination, no polyuria NEUROLOGICAL: No headache, dizziness, syncope, paralysis, ataxia, numbness or tingling in the extremities. No change in bowel or bladder control.  MUSCULOSKELETAL: No muscle, back pain, joint pain or stiffness.  LYMPHATICS: No enlarged nodes. No history of splenectomy.  PSYCHIATRIC: No history of depression or anxiety.  ENDOCRINOLOGIC: No reports of sweating, cold or heat intolerance. No polyuria or polydipsia.  Marland Kitchen   Physical Examination Today's Vitals   10/12/22 1136  BP: 128/78  Pulse: 60  SpO2: 99%  Weight: 221 lb (100.2 kg)  Height: 5\' 9"  (1.753 m)   Body mass index is 32.64 kg/m.  Gen: resting comfortably, no acute distress HEENT: no scleral icterus, pupils equal round and reactive, no palptable cervical adenopathy,  CV: irreg, no m/rg, no jvd Resp: Clear to auscultation bilaterally GI: abdomen is soft, non-tender, non-distended, normal bowel sounds, no hepatosplenomegaly MSK: extremities are warm, no edema.  Skin: warm, no rash Neuro:  no focal deficits Psych: appropriate affect   Diagnostic Studies     Assessment and Plan  1.Aflutter/acquired thrombophilia - prior ablation - recurrent aflutter with slow ventricular response. Flutter has been asymptomatic and mixed compliance with anticoag so have not pursued DCCV - self rate controlled, off coreg due to low HRs - continue current meds - given info on eliquis patient assistance, asked to contact us if does not qualify and would start coumadin - EKG today shows rate controlled aflutter, RBBB   2. HTN - swelling on norvasc, - some renal dysfunction affects med options - bp at goal, continue current meds   3. Hyperlipidemia -f/u upcoming pcp labs, continue current meds   F/u 6  months     Antoine Poche, M.D.

## 2022-10-12 NOTE — Patient Instructions (Signed)
Medication Instructions:  Your physician recommends that you continue on your current medications as directed. Please refer to the Current Medication list given to you today.   Labwork: None today  Testing/Procedures: None today  Follow-Up: 6 months  Any Other Special Instructions Will Be Listed Below (If Applicable).  If you need a refill on your cardiac medications before your next appointment, please call your pharmacy.  

## 2022-11-07 ENCOUNTER — Other Ambulatory Visit: Payer: Self-pay | Admitting: Cardiology

## 2022-11-07 ENCOUNTER — Encounter: Payer: Self-pay | Admitting: Cardiology

## 2022-11-08 MED ORDER — APIXABAN 5 MG PO TABS: 5.0000 mg | ORAL_TABLET | Freq: Two times a day (BID) | ORAL | 5 refills | Status: DC

## 2022-11-08 NOTE — Addendum Note (Signed)
Addended by: Louanna Raw on: 11/08/2022 07:14 AM   Modules accepted: Orders

## 2022-11-08 NOTE — Telephone Encounter (Signed)
Based on age, weight, renal function dose of eliquis would be 5mg  bid  Dominga Ferry MD

## 2022-11-08 NOTE — Telephone Encounter (Signed)
Prescription refill request for Eliquis received. Indication: A Flutter Last office visit:10/12/22  Dominga Ferry MD Scr: 1.60 on 05/09/22  Epic Age: 67 Weight: 100.2kg   Based on above findings Eliquis 5mg  twice daily is the appropriate dose.  Refill approved.

## 2023-01-27 ENCOUNTER — Other Ambulatory Visit: Payer: Self-pay | Admitting: Cardiology

## 2023-01-29 ENCOUNTER — Other Ambulatory Visit: Payer: Self-pay | Admitting: Cardiology

## 2023-03-04 ENCOUNTER — Other Ambulatory Visit: Payer: Self-pay | Admitting: Cardiology

## 2023-03-18 ENCOUNTER — Other Ambulatory Visit: Payer: Self-pay | Admitting: Cardiology

## 2023-04-10 ENCOUNTER — Other Ambulatory Visit: Payer: Self-pay | Admitting: Cardiology

## 2023-04-15 ENCOUNTER — Other Ambulatory Visit: Payer: Self-pay | Admitting: Cardiology

## 2023-04-15 MED ORDER — SPIRONOLACTONE 25 MG PO TABS: 12.5000 mg | ORAL_TABLET | Freq: Every day | ORAL | 2 refills | Status: DC

## 2023-04-17 ENCOUNTER — Other Ambulatory Visit: Payer: Self-pay | Admitting: *Deleted

## 2023-04-17 MED ORDER — CHLORTHALIDONE 25 MG PO TABS: 12.5000 mg | ORAL_TABLET | Freq: Every day | ORAL | 1 refills | Status: DC

## 2023-04-18 ENCOUNTER — Encounter: Payer: Self-pay | Admitting: Cardiology

## 2023-04-18 ENCOUNTER — Ambulatory Visit: Payer: Medicare (Managed Care) | Attending: Cardiology | Admitting: Cardiology

## 2023-04-18 VITALS — BP 132/86 | HR 54 | Ht 69.5 in | Wt 237.2 lb

## 2023-04-18 DIAGNOSIS — E782 Mixed hyperlipidemia: Secondary | ICD-10-CM

## 2023-04-18 DIAGNOSIS — I10 Essential (primary) hypertension: Secondary | ICD-10-CM

## 2023-04-18 DIAGNOSIS — D6869 Other thrombophilia: Secondary | ICD-10-CM

## 2023-04-18 DIAGNOSIS — Z79899 Other long term (current) drug therapy: Secondary | ICD-10-CM

## 2023-04-18 DIAGNOSIS — I4892 Unspecified atrial flutter: Secondary | ICD-10-CM | POA: Diagnosis not present

## 2023-04-18 NOTE — Patient Instructions (Signed)
Medication Instructions:   Continue all current medications.   Labwork:  CMET, CBC, FLP, Mg, TSH, HgA1c - orders given today Reminder:  Nothing to eat or drink after 12 midnight prior to labs. Office will contact with results via phone, letter or mychart.     Testing/Procedures:  none  Follow-Up:  6 months   Any Other Special Instructions Will Be Listed Below (If Applicable).   If you need a refill on your cardiac medications before your next appointment, please call your pharmacy.

## 2023-04-18 NOTE — Progress Notes (Signed)
Clinical Summary Luis Nunez is a 68 y.o.male seen today for follow up of the following medical problems.      1.HTN - has had some LE edema on norvasc, now off  - compliant with meds     2. Afluttter - history of aflutter ablation, however has had recurrent aflutter -aflutter has been asymptomatic and rate controlled. Due to low rates coreg stopped, has been self rate controlled  - has not been able to start eliquis due to cost  - very rare palpitations - able to get eliquis through his insurance.      3. Hyperlipidemia/Primary prevention - he is on atorvastatin - overdue for labs, does not have pcp currently    4. CKD IIIA Past Medical History:  Diagnosis Date   Dysrhythmia 02/2017   A flutter   Hypertension      Allergies  Allergen Reactions   Penicillins Rash and Other (See Comments)    Has patient had a PCN reaction causing immediate rash, facial/tongue/throat swelling, SOB or lightheadedness with hypotension: yes Has patient had a PCN reaction causing severe rash involving mucus membranes or skin necrosis: No Has patient had a PCN reaction that required hospitalization No Has patient had a PCN reaction occurring within the last 10 years: No If all of the above answers are "NO", then may proceed with Cephalosporin use.     Current Outpatient Medications  Medication Sig Dispense Refill   apixaban (ELIQUIS) 5 MG TABS tablet Take 1 tablet (5 mg total) by mouth 2 (two) times daily. 60 tablet 5   atorvastatin (LIPITOR) 40 MG tablet TAKE 1 TABLET BY MOUTH EVERY DAY 90 tablet 2   chlorthalidone (HYGROTON) 25 MG tablet Take 0.5 tablets (12.5 mg total) by mouth daily. 45 tablet 1   hydrALAZINE (APRESOLINE) 25 MG tablet TAKE 1 TABLET BY MOUTH TWICE A DAY (Patient not taking: Reported on 10/12/2022) 180 tablet 2   hydrALAZINE (APRESOLINE) 50 MG tablet TAKE 1 TABLET BY MOUTH TWICE A DAY 180 tablet 2   lisinopril (ZESTRIL) 40 MG tablet TAKE 1 TABLET BY MOUTH EVERY  DAY 90 tablet 1   spironolactone (ALDACTONE) 25 MG tablet Take 0.5 tablets (12.5 mg total) by mouth daily. 45 tablet 2   No current facility-administered medications for this visit.     Past Surgical History:  Procedure Laterality Date   A-FLUTTER ABLATION N/A 08/16/2017   Procedure: A-FLUTTER ABLATION;  Surgeon: Regan Lemming, MD;  Location: [facility];  Service: Cardiovascular;  Laterality: N/A;   ATRIAL FLUTTER ABLATION  08/16/2017   JOINT REPLACEMENT     TOTAL HIP ARTHROPLASTY Left 03/19/2017   Procedure: LEFT TOTAL HIP ARTHROPLASTY ANTERIOR APPROACH;  Surgeon: Durene Romans, MD;  Location: [facility];  Service: Orthopedics;  Laterality: Left;  70 mins   TOTAL HIP ARTHROPLASTY Right 06/25/2017   Procedure: RIGHT TOTAL HIP ARTHROPLASTY ANTERIOR APPROACH;  Surgeon: Durene Romans, MD;  Location: [facility];  Service: Orthopedics;  Laterality: Right;  70 mins     Allergies  Allergen Reactions   Penicillins Rash and Other (See Comments)    Has patient had a PCN reaction causing immediate rash, facial/tongue/throat swelling, SOB or lightheadedness with hypotension: yes Has patient had a PCN reaction causing severe rash involving mucus membranes or skin necrosis: No Has patient had a PCN reaction that required hospitalization No Has patient had a PCN reaction occurring within the last 10 years: No If all of the above answers are "NO",  then may proceed with Cephalosporin use.      Family History  Problem Relation Age of Onset   High blood pressure Sister    Breast cancer Sister    Colon cancer Brother    Bone cancer Brother      Social History Mr. Goynes reports that he has never smoked. He has never been exposed to tobacco smoke. His smokeless tobacco use includes snuff. Mr. Durden reports current alcohol use.      Physical Examination Today's Vitals   04/18/23 1135  BP: 132/86  Pulse: (!) 54  SpO2: 100%  Weight: 237 lb 3.2 oz (107.6 kg)  Height: 5' 9.5"  (1.765 m)   Body mass index is 34.53 kg/m.  Gen: resting comfortably, no acute distress HEENT: no scleral icterus, pupils equal round and reactive, no palptable cervical adenopathy,  CV: regular, no mrg, no jvd Resp: Clear to auscultation bilaterally GI: abdomen is soft, non-tender, non-distended, normal bowel sounds, no hepatosplenomegaly MSK: extremities are warm, no edema.  Skin: warm, no rash Neuro:  no focal deficits Psych: appropriate affect     Assessment and Plan   1.Aflutter/acquired thrombophilia - prior ablation - recurrent aflutter with slow ventricular response. Flutter has been asymptomatic and self rate controlled and thus have not pursued DCCV - continue current meds   2. HTN - swelling on norvasc, - some renal dysfunction affects med options -at goal today, continue current meds   3. Hyperlipidemia -repeat labs  Does not have pcp, will order annual labs   F/u 6 months     Antoine Poche, M.D

## 2023-04-20 ENCOUNTER — Encounter: Payer: Self-pay | Admitting: Cardiology

## 2023-04-22 MED ORDER — HYDRALAZINE HCL 50 MG PO TABS: 50.0000 mg | ORAL_TABLET | Freq: Two times a day (BID) | ORAL | Status: DC

## 2023-05-06 ENCOUNTER — Other Ambulatory Visit: Payer: Self-pay | Admitting: *Deleted

## 2023-05-06 MED ORDER — APIXABAN 5 MG PO TABS: 5.0000 mg | ORAL_TABLET | Freq: Two times a day (BID) | ORAL | 0 refills | Status: DC

## 2023-05-06 NOTE — Telephone Encounter (Signed)
 Prescription refill request for Eliquis received. Indication: A Flutter Last office visit: 04/18/23  Dominga Ferry MD Scr: 1.60 on 05/09/22  Epic.  (Pending labs) Age:  68 Weight: 107.6kg  Based on above findings Eliquis 5mg  twice daily is the appropriate dose.  Refill approved.

## 2023-07-01 ENCOUNTER — Encounter: Payer: Self-pay | Admitting: Cardiology

## 2023-07-02 ENCOUNTER — Other Ambulatory Visit: Payer: Self-pay | Admitting: *Deleted

## 2023-07-02 MED ORDER — LISINOPRIL 40 MG PO TABS: 40.0000 mg | ORAL_TABLET | Freq: Every day | ORAL | 1 refills | Status: DC

## 2023-07-02 MED ORDER — SPIRONOLACTONE 25 MG PO TABS: 25.0000 mg | ORAL_TABLET | Freq: Every day | ORAL | 1 refills | Status: DC

## 2023-07-02 MED ORDER — HYDRALAZINE HCL 50 MG PO TABS: 50.0000 mg | ORAL_TABLET | Freq: Two times a day (BID) | ORAL | 1 refills | Status: DC

## 2023-07-05 ENCOUNTER — Other Ambulatory Visit: Payer: Self-pay | Admitting: Cardiology

## 2023-10-06 ENCOUNTER — Other Ambulatory Visit: Payer: Self-pay | Admitting: Cardiology

## 2023-10-07 ENCOUNTER — Other Ambulatory Visit: Payer: Self-pay

## 2023-10-07 MED ORDER — HYDRALAZINE HCL 50 MG PO TABS: 50.0000 mg | ORAL_TABLET | Freq: Two times a day (BID) | ORAL | 1 refills | Status: DC

## 2023-10-07 NOTE — Telephone Encounter (Signed)
 Prescription refill request for Eliquis  received. Indication:aflutter Last office visit:2/25 Scr:1.63  3/25 Age: 68 Weight:107.6  kg  Prescription refilled

## 2023-10-11 ENCOUNTER — Other Ambulatory Visit: Payer: Self-pay

## 2023-10-11 MED ORDER — ATORVASTATIN CALCIUM 40 MG PO TABS: 40.0000 mg | ORAL_TABLET | Freq: Every day | ORAL | 1 refills | Status: DC

## 2023-12-17 ENCOUNTER — Other Ambulatory Visit: Payer: Self-pay | Admitting: Cardiology

## 2024-02-05 ENCOUNTER — Ambulatory Visit: Payer: Medicare (Managed Care) | Attending: Cardiology | Admitting: Cardiology

## 2024-02-05 ENCOUNTER — Encounter: Payer: Self-pay | Admitting: Cardiology

## 2024-02-05 VITALS — BP 120/75 | HR 49 | Ht 70.0 in | Wt 212.6 lb

## 2024-02-05 DIAGNOSIS — D6869 Other thrombophilia: Secondary | ICD-10-CM | POA: Diagnosis not present

## 2024-02-05 DIAGNOSIS — N1831 Chronic kidney disease, stage 3a: Secondary | ICD-10-CM

## 2024-02-05 DIAGNOSIS — I10 Essential (primary) hypertension: Secondary | ICD-10-CM

## 2024-02-05 DIAGNOSIS — Z79899 Other long term (current) drug therapy: Secondary | ICD-10-CM | POA: Diagnosis not present

## 2024-02-05 DIAGNOSIS — I4892 Unspecified atrial flutter: Secondary | ICD-10-CM

## 2024-02-05 DIAGNOSIS — E782 Mixed hyperlipidemia: Secondary | ICD-10-CM

## 2024-02-05 NOTE — Progress Notes (Signed)
 Clinical Summary Mr. Albergo is a 68 y.o.male seen today for follow up of the following medical problems.      1.HTN - has had some LE edema on norvasc , now off   - compliant with meds      2. Afluttter - history of aflutter ablation, however has had recurrent aflutter -aflutter has been asymptomatic and rate controlled. Due to low rates coreg  stopped, has been self rate controlled   - no recent palpitations. No lightheadedness or dizziness.  - no bleeding on eliquis .      3. Hyperlipidemia/Primary prevention - he is on atorvastatin     05/2023 TC 118 TG 84 HDL 46 LDL 55  4. CKD IIIA      Past Medical History:  Diagnosis Date   Dysrhythmia 02/2017   A flutter   Hypertension      Allergies  Allergen Reactions   Penicillins Rash and Other (See Comments)    Has patient had a PCN reaction causing immediate rash, facial/tongue/throat swelling, SOB or lightheadedness with hypotension: yes Has patient had a PCN reaction causing severe rash involving mucus membranes or skin necrosis: No Has patient had a PCN reaction that required hospitalization No Has patient had a PCN reaction occurring within the last 10 years: No If all of the above answers are NO, then may proceed with Cephalosporin use.     Current Outpatient Medications  Medication Sig Dispense Refill   apixaban  (ELIQUIS ) 5 MG TABS tablet Take 1 tablet (5 mg total) by mouth 2 (two) times daily. 180 tablet 3   atorvastatin  (LIPITOR) 40 MG tablet Take 1 tablet (40 mg total) by mouth daily. 90 tablet 1   chlorthalidone  (HYGROTON ) 25 MG tablet TAKE ONE-HALF (1/2) TABLET DAILY 45 tablet 1   hydrALAZINE  (APRESOLINE ) 50 MG tablet Take 1 tablet (50 mg total) by mouth 2 (two) times daily. 180 tablet 1   lisinopril  (ZESTRIL ) 40 MG tablet Take 1 tablet (40 mg total) by mouth daily. 30 tablet 1   spironolactone  (ALDACTONE ) 25 MG tablet TAKE 1 TABLET DAILY 90 tablet 1   No current facility-administered  medications for this visit.     Past Surgical History:  Procedure Laterality Date   A-FLUTTER ABLATION N/A 08/16/2017   Procedure: A-FLUTTER ABLATION;  Surgeon: Inocencio Soyla Lunger, MD;  Location: [facility];  Service: Cardiovascular;  Laterality: N/A;   ATRIAL FLUTTER ABLATION  08/16/2017   JOINT REPLACEMENT     TOTAL HIP ARTHROPLASTY Left 03/19/2017   Procedure: LEFT TOTAL HIP ARTHROPLASTY ANTERIOR APPROACH;  Surgeon: Ernie Cough, MD;  Location: [facility];  Service: Orthopedics;  Laterality: Left;  70 mins   TOTAL HIP ARTHROPLASTY Right 06/25/2017   Procedure: RIGHT TOTAL HIP ARTHROPLASTY ANTERIOR APPROACH;  Surgeon: Ernie Cough, MD;  Location: [facility];  Service: Orthopedics;  Laterality: Right;  70 mins     Allergies  Allergen Reactions   Penicillins Rash and Other (See Comments)    Has patient had a PCN reaction causing immediate rash, facial/tongue/throat swelling, SOB or lightheadedness with hypotension: yes Has patient had a PCN reaction causing severe rash involving mucus membranes or skin necrosis: No Has patient had a PCN reaction that required hospitalization No Has patient had a PCN reaction occurring within the last 10 years: No If all of the above answers are NO, then may proceed with Cephalosporin use.      Family History  Problem Relation Age of Onset   High blood pressure Sister  Breast cancer Sister    Colon cancer Brother    Bone cancer Brother      Social History Mr. Riner reports that he has never smoked. He has never been exposed to tobacco smoke. His smokeless tobacco use includes snuff. Mr. Orndoff reports current alcohol use.     Physical Examination Vitals:   02/05/24 0825 02/05/24 0847  BP: (!) 154/82 120/75  Pulse: (!) 49   SpO2: 97%    Filed Weights   02/05/24 0825  Weight: 212 lb 9.6 oz (96.4 kg)    Gen: resting comfortably, no acute distress HEENT: no scleral icterus, pupils equal round and reactive, no palptable  cervical adenopathy,  CV: irreg, 2/6 systolic murmur apex Resp: Clear to auscultation bilaterally GI: abdomen is soft, non-tender, non-distended, normal bowel sounds, no hepatosplenomegaly MSK: extremities are warm, no edema.  Skin: warm, no rash Neuro:  no focal deficits Psych: appropriate affect   Diagnostic Studies     Assessment and Plan   1.Aflutter/acquired thrombophilia - prior ablation - recurrent aflutter with slow ventricular response. Flutter has been asymptomatic and self rate controlled and thus have not pursued DCCV - no symptoms - EKG today shows aflutter rate 49, RBBB   2. HTN - swelling on norvasc , - bp at goal, continue current meds   3. Hyperlipidemia - lipids at goal, continue current meds  4. CKD 3A - will repeat bmet  F/u 6 months   Dorn PHEBE Ross, M.D.

## 2024-02-05 NOTE — Patient Instructions (Addendum)
 Medication Instructions:   Continue all current medications.   Labwork:  BMET - order given today Office will contact with results via phone, letter or mychart.     Testing/Procedures:  none  Follow-Up:  6 months   Any Other Special Instructions Will Be Listed Below (If Applicable).   If you need a refill on your cardiac medications before your next appointment, please call your pharmacy.

## 2024-02-06 ENCOUNTER — Telehealth: Payer: Self-pay | Admitting: Cardiology

## 2024-02-06 MED ORDER — ATORVASTATIN CALCIUM 40 MG PO TABS: 40.0000 mg | ORAL_TABLET | Freq: Every day | ORAL | 1 refills | Status: AC

## 2024-02-06 MED ORDER — CHLORTHALIDONE 25 MG PO TABS: 12.5000 mg | ORAL_TABLET | Freq: Every day | ORAL | 1 refills | Status: AC

## 2024-02-06 NOTE — Telephone Encounter (Signed)
*  STAT* If patient is at the pharmacy, call can be transferred to refill team.   1. Which medications need to be refilled? (please list name of each medication and dose if known)  chlorthalidone  (HYGROTON ) 25 MG tablet atorvastatin  (LIPITOR) 40 MG tablet  2. Which pharmacy/location (including street and city if local pharmacy) is medication to be sent to? [id number] - [place], [place] - [address]   3. Do they need a 30 day or 90 day supply?  90 day supply  Patient says he no longer gets his medications from [facility] and he would like to have active prescriptions transferred to his local [facility] if possible.

## 2024-02-06 NOTE — Telephone Encounter (Signed)
 Refill sent

## 2024-02-20 ENCOUNTER — Other Ambulatory Visit: Payer: Self-pay | Admitting: Cardiology

## 2024-03-05 ENCOUNTER — Other Ambulatory Visit: Payer: Self-pay | Admitting: Cardiology
